# Patient Record
Sex: Female | Born: 1935 | Race: White | Hispanic: No | Smoking: Never smoker
Health system: Southern US, Community
[De-identification: ages and names within clinical notes are randomized; demographics above are authoritative.]

## PROBLEM LIST (undated history)

## (undated) DIAGNOSIS — C801 Malignant (primary) neoplasm, unspecified: Secondary | ICD-10-CM

## (undated) DIAGNOSIS — I10 Essential (primary) hypertension: Secondary | ICD-10-CM

## (undated) DIAGNOSIS — J189 Pneumonia, unspecified organism: Secondary | ICD-10-CM

## (undated) HISTORY — PX: COLONOSCOPY: SHX174

## (undated) HISTORY — PX: CATARACT EXTRACTION, BILATERAL: SHX1313

---

## 2001-02-13 ENCOUNTER — Encounter: Payer: Self-pay | Admitting: Emergency Medicine

## 2001-02-13 ENCOUNTER — Emergency Department (HOSPITAL_COMMUNITY): Admission: EM | Admit: 2001-02-13 | Discharge: 2001-02-13 | Payer: Self-pay

## 2001-03-15 ENCOUNTER — Emergency Department (HOSPITAL_COMMUNITY): Admission: EM | Admit: 2001-03-15 | Discharge: 2001-03-15 | Payer: Self-pay | Admitting: Emergency Medicine

## 2001-11-06 ENCOUNTER — Emergency Department (HOSPITAL_COMMUNITY): Admission: EM | Admit: 2001-11-06 | Discharge: 2001-11-06 | Payer: Self-pay | Admitting: Emergency Medicine

## 2002-05-23 ENCOUNTER — Emergency Department (HOSPITAL_COMMUNITY): Admission: EM | Admit: 2002-05-23 | Discharge: 2002-05-23 | Payer: Self-pay | Admitting: Emergency Medicine

## 2002-05-23 ENCOUNTER — Encounter: Payer: Self-pay | Admitting: Emergency Medicine

## 2003-06-01 ENCOUNTER — Emergency Department (HOSPITAL_COMMUNITY): Admission: EM | Admit: 2003-06-01 | Discharge: 2003-06-01 | Payer: Self-pay | Admitting: Emergency Medicine

## 2004-05-19 ENCOUNTER — Emergency Department (HOSPITAL_COMMUNITY): Admission: EM | Admit: 2004-05-19 | Discharge: 2004-05-19 | Payer: Self-pay | Admitting: Emergency Medicine

## 2004-05-24 ENCOUNTER — Ambulatory Visit (HOSPITAL_COMMUNITY): Admission: RE | Admit: 2004-05-24 | Discharge: 2004-05-24 | Payer: Self-pay | Admitting: Family Medicine

## 2007-06-22 ENCOUNTER — Emergency Department (HOSPITAL_COMMUNITY): Admission: EM | Admit: 2007-06-22 | Discharge: 2007-06-22 | Payer: Self-pay | Admitting: Emergency Medicine

## 2010-04-25 ENCOUNTER — Emergency Department (HOSPITAL_COMMUNITY): Admission: EM | Admit: 2010-04-25 | Discharge: 2010-04-26 | Payer: Self-pay | Admitting: Emergency Medicine

## 2010-10-28 LAB — BASIC METABOLIC PANEL
BUN: 17 mg/dL (ref 6–23)
CO2: 25 mEq/L (ref 19–32)
Calcium: 9.3 mg/dL (ref 8.4–10.5)
Chloride: 101 mEq/L (ref 96–112)
Creatinine, Ser: 0.85 mg/dL (ref 0.4–1.2)
GFR calc Af Amer: >60 mL/min (ref >60)
GFR calc non Af Amer: >60 mL/min (ref >60)
Glucose, Bld: 126 mg/dL — ABNORMAL HIGH (ref 70–99)
Potassium: 3.6 mEq/L (ref 3.5–5.1)
Sodium: 135 mEq/L (ref 135–145)

## 2010-10-28 LAB — CBC
HCT: 41.3 % (ref 36.0–46.0)
Hemoglobin: 14.6 g/dL (ref 12.0–15.0)
MCH: 32.5 pg (ref 26.0–34.0)
MCHC: 35.3 g/dL (ref 30.0–36.0)
MCV: 92 fL (ref 78.0–100.0)
Platelets: 264 10*3/uL (ref 150–400)
RBC: 4.49 MIL/uL (ref 3.87–5.11)
RDW: 13.4 % (ref 11.5–15.5)
WBC: 7.6 10*3/uL (ref 4.0–10.5)

## 2010-10-28 LAB — DIFFERENTIAL
Basophils Absolute: 0 10*3/uL (ref 0.0–0.1)
Eosinophils Relative: 2 % (ref 0–5)
Lymphocytes Relative: 15 % (ref 12–46)
Neutrophils Relative %: 75 % (ref 43–77)

## 2010-10-28 LAB — POCT CARDIAC MARKERS
Myoglobin, poc: 30.6 ng/mL (ref 12–200)
Troponin i, poc: <0.05 ng/mL (ref 0.00–0.09)
Troponin i, poc: <0.05 ng/mL (ref 0.00–0.09)

## 2011-05-24 LAB — PROTIME-INR
INR: 0.9
Prothrombin Time: 11.8

## 2011-05-24 LAB — POCT CARDIAC MARKERS
CKMB, poc: 1.5
Operator id: 4708
Operator id: 4708
Troponin i, poc: <0.05
Troponin i, poc: <0.05

## 2011-05-24 LAB — COMPREHENSIVE METABOLIC PANEL
Alkaline Phosphatase: 82
BUN: 15
Chloride: 105
GFR calc non Af Amer: >60
Glucose, Bld: 122 — ABNORMAL HIGH
Potassium: 3.8
Total Bilirubin: 0.8

## 2011-05-24 LAB — CBC
HCT: 45
Hemoglobin: 15.7 — ABNORMAL HIGH
RDW: 12.8
WBC: 8.3

## 2011-05-24 LAB — DIFFERENTIAL
Basophils Absolute: 0.1
Basophils Relative: 2 — ABNORMAL HIGH
Neutro Abs: 6
Neutrophils Relative %: 72

## 2013-12-07 ENCOUNTER — Encounter (HOSPITAL_COMMUNITY): Payer: Self-pay | Admitting: Emergency Medicine

## 2013-12-07 ENCOUNTER — Emergency Department (HOSPITAL_COMMUNITY): Payer: Medicare Other

## 2013-12-07 ENCOUNTER — Emergency Department (HOSPITAL_COMMUNITY)
Admission: EM | Admit: 2013-12-07 | Discharge: 2013-12-07 | Disposition: A | Discharge status: Home / Self Care | Payer: Medicare Other | Attending: Emergency Medicine | Admitting: Emergency Medicine

## 2013-12-07 DIAGNOSIS — IMO0002 Reserved for concepts with insufficient information to code with codable children: Secondary | ICD-10-CM | POA: Insufficient documentation

## 2013-12-07 DIAGNOSIS — Z23 Encounter for immunization: Secondary | ICD-10-CM | POA: Insufficient documentation

## 2013-12-07 DIAGNOSIS — S1093XA Contusion of unspecified part of neck, initial encounter: Secondary | ICD-10-CM

## 2013-12-07 DIAGNOSIS — S0003XA Contusion of scalp, initial encounter: Secondary | ICD-10-CM | POA: Insufficient documentation

## 2013-12-07 DIAGNOSIS — Y939 Activity, unspecified: Secondary | ICD-10-CM | POA: Insufficient documentation

## 2013-12-07 DIAGNOSIS — S0081XA Abrasion of other part of head, initial encounter: Secondary | ICD-10-CM

## 2013-12-07 DIAGNOSIS — I10 Essential (primary) hypertension: Secondary | ICD-10-CM | POA: Insufficient documentation

## 2013-12-07 DIAGNOSIS — W010XXA Fall on same level from slipping, tripping and stumbling without subsequent striking against object, initial encounter: Secondary | ICD-10-CM | POA: Insufficient documentation

## 2013-12-07 DIAGNOSIS — S0083XA Contusion of other part of head, initial encounter: Secondary | ICD-10-CM | POA: Insufficient documentation

## 2013-12-07 DIAGNOSIS — Y9289 Other specified places as the place of occurrence of the external cause: Secondary | ICD-10-CM | POA: Insufficient documentation

## 2013-12-07 HISTORY — DX: Essential (primary) hypertension: I10

## 2013-12-07 MED ORDER — CLINDAMYCIN HCL 150 MG PO CAPS: 150.0000 mg | ORAL_CAPSULE | Freq: Three times a day (TID) | ORAL | Status: DC

## 2013-12-07 MED ORDER — TETANUS-DIPHTH-ACELL PERTUSSIS 5-2.5-18.5 LF-MCG/0.5 IM SUSP
0.5000 mL | Freq: Once | INTRAMUSCULAR | Status: AC
  Administered 2013-12-07: 0.5 mL via INTRAMUSCULAR
  Filled 2013-12-07: qty 0.5

## 2013-12-07 NOTE — ED Provider Notes (Signed)
CSN: [id number]     Arrival date & time 12/07/13  1141 History   First MD Initiated Contact with Patient 12/07/13 1207     Chief Complaint  Patient presents with  . Fall  . Facial Injury     (Consider location/radiation/quality/duration/timing/severity/associated sxs/prior Treatment) Patient is a 80 y.o. female presenting with fall and facial injury. The history is provided by the patient and a relative.  Fall  Facial Injury  patient fell 4 days ago when she tripped over a step. No loss of consciousness. Suffered abrasions to the right side of her face which were treated with peroxide and vitamin E cream. Went to urgent care today and was sent here for evaluation of possible intercranial hemorrhage. He felt that the patient's pupils weren't equal. She herself denies any confusion. No increased lethargy. She's had some dizziness has been persistent. Denies any focal weakness. No vomiting. Denies any neck pain. Has used over-the-counter medications for symptomatic relief for her face. Denies any visual changes. No trouble with mastication.  Past Medical History  Diagnosis Date  . Hypertension    History reviewed. No pertinent past surgical history. No family history on file. History  Substance Use Topics  . Smoking status: Never Smoker   . Smokeless tobacco: Not on file  . Alcohol Use: Yes     Comment: socially   OB History   Grav Para Term Preterm Abortions TAB SAB Ect Mult Living                 Review of Systems  All other systems reviewed and are negative.     Allergies  Wasp venom  Home Medications   Prior to Admission medications   Not on File   BP 213/109  Pulse 87  Temp(Src) 97.7 F (36.5 C) (Oral)  Resp 16  SpO2 97% Physical Exam  Nursing note and vitals reviewed. Constitutional: She is oriented to person, place, and time. She appears well-developed and well-nourished.  Non-toxic appearance. No distress.  HENT:  Head: Normocephalic and atraumatic.     Eyes: Conjunctivae, EOM and lids are normal. Pupils are equal, round, and reactive to light.  Neck: Normal range of motion. Neck supple. No tracheal deviation present. No mass present.  Cardiovascular: Normal rate, regular rhythm and normal heart sounds.  Exam reveals no gallop.   No murmur heard. Pulmonary/Chest: Effort normal and breath sounds normal. No stridor. No respiratory distress. She has no decreased breath sounds. She has no wheezes. She has no rhonchi. She has no rales.  Abdominal: Soft. Normal appearance and bowel sounds are normal. She exhibits no distension. There is no tenderness. There is no rebound and no CVA tenderness.  Musculoskeletal: Normal range of motion. She exhibits no edema and no tenderness.  Neurological: She is alert and oriented to person, place, and time. She has normal strength. No cranial nerve deficit or sensory deficit. GCS eye subscore is 4. GCS verbal subscore is 5. GCS motor subscore is 6.  Skin: Skin is warm and dry. No abrasion and no rash noted.  Psychiatric: She has a normal mood and affect. Her speech is normal and behavior is normal.    ED Course  Procedures (including critical care time) Labs Review Labs Reviewed - No data to display  Imaging Review No results found.   EKG Interpretation None      MDM   Final diagnoses:  None    xrays neg for acute fracture, tetanus updated--blood pressure elevation noted and pt instructed  to f/u her pcp    Leota Jacobsen, MD 12/07/13 1414

## 2013-12-07 NOTE — ED Notes (Signed)
Patient transported to CT 

## 2013-12-07 NOTE — ED Notes (Signed)
Bruising noted to r side of face.

## 2013-12-07 NOTE — ED Notes (Signed)
MD at bedside. 

## 2013-12-07 NOTE — ED Notes (Addendum)
Pt from home c/o tripping on a stone outside, falling onto a paved sidewalk. Pt has abrasions and bruising to R side of face. Pt denies LOC. Pt states that she had neck pain , but went to chiropractor and he fixed it. Pt PCP sent pt for scan because she is having dizziness, nausea and pt R eye is slightly elevated and HTN. Pt denies visual changes. Pt is A&O and in NAD. Pt does not take blood thinners

## 2013-12-07 NOTE — Discharge Instructions (Signed)
Use neopsorin to your face once per day, start the antibiotics only if you note increased redness--follow up with your doctor for your elevated blood pressure Abrasion An abrasion is a cut or scrape of the skin. Abrasions do not extend through all layers of the skin and most heal within 10 days. It is important to care for your abrasion properly to prevent infection. CAUSES  Most abrasions are caused by falling on, or gliding across, the ground or other surface. When your skin rubs on something, the outer and inner layer of skin rubs off, causing an abrasion. DIAGNOSIS  Your caregiver will be able to diagnose an abrasion during a physical exam.  TREATMENT  Your treatment depends on how large and deep the abrasion is. Generally, your abrasion will be cleaned with water and a mild soap to remove any dirt or debris. An antibiotic ointment may be put over the abrasion to prevent an infection. A bandage (dressing) may be wrapped around the abrasion to keep it from getting dirty.  You may need a tetanus shot if:  You cannot remember when you had your last tetanus shot.  You have never had a tetanus shot.  The injury broke your skin. If you get a tetanus shot, your arm may swell, get red, and feel warm to the touch. This is common and not a problem. If you need a tetanus shot and you choose not to have one, there is a rare chance of getting tetanus. Sickness from tetanus can be serious.  HOME CARE INSTRUCTIONS   If a dressing was applied, change it at least once a day or as directed by your caregiver. If the bandage sticks, soak it off with warm water.   Wash the area with water and a mild soap to remove all the ointment 2 times a day. Rinse off the soap and pat the area dry with a clean towel.   Reapply any ointment as directed by your caregiver. This will help prevent infection and keep the bandage from sticking. Use gauze over the wound and under the dressing to help keep the bandage from  sticking.   Change your dressing right away if it becomes wet or dirty.   Only take over-the-counter or prescription medicines for pain, discomfort, or fever as directed by your caregiver.   Follow up with your caregiver within 24 48 hours for a wound check, or as directed. If you were not given a wound-check appointment, look closely at your abrasion for redness, swelling, or pus. These are signs of infection. SEEK IMMEDIATE MEDICAL CARE IF:   You have increasing pain in the wound.   You have redness, swelling, or tenderness around the wound.   You have pus coming from the wound.   You have a fever or persistent symptoms for more than 2 3 days.  You have a fever and your symptoms suddenly get worse.  You have a bad smell coming from the wound or dressing.  MAKE SURE YOU:   Understand these instructions.  Will watch your condition.  Will get help right away if you are not doing well or get worse. Document Released: 05/11/2005 Document Revised: 07/18/2012 Document Reviewed: 07/05/2011 [facility] Patient Information 2014 [place], [place]. Head Injury, Adult You have received a head injury. It does not appear serious at this time. Headaches and vomiting are common following head injury. It should be easy to awaken from sleeping. Sometimes it is necessary for you to stay in the emergency department for a  while for observation. Sometimes admission to the hospital may be needed. After injuries such as yours, most problems occur within the first 24 hours, but side effects may occur up to 7 10 days after the injury. It is important for you to carefully monitor your condition and contact your health care provider or seek immediate medical care if there is a change in your condition. WHAT ARE THE TYPES OF HEAD INJURIES? Head injuries can be as minor as a bump. Some head injuries can be more severe. More severe head injuries include:  A jarring injury to the brain (concussion).  A bruise  of the brain (contusion). This mean there is bleeding in the brain that can cause swelling.  A cracked skull (skull fracture).  Bleeding in the brain that collects, clots, and forms a bump (hematoma). WHAT CAUSES A HEAD INJURY? A serious head injury is most likely to happen to someone who is in a car wreck and is not wearing a seat belt. Other causes of major head injuries include bicycle or motorcycle accidents, sports injuries, and falls. HOW ARE HEAD INJURIES DIAGNOSED? A complete history of the event leading to the injury and your current symptoms will be helpful in diagnosing head injuries. Many times, pictures of the brain, such as CT or MRI are needed to see the extent of the injury. Often, an overnight hospital stay is necessary for observation.  WHEN SHOULD I SEEK IMMEDIATE MEDICAL CARE?  You should get help right away if:  You have confusion or drowsiness.  You feel sick to your stomach (nauseous) or have continued, forceful vomiting.  You have dizziness or unsteadiness that is getting worse.  You have severe, continued headaches not relieved by medicine. Only take over-the-counter or prescription medicines for pain, fever, or discomfort as directed by your health care provider.  You do not have normal function of the arms or legs or are unable to walk.  You notice changes in the black spots in the center of the colored part of your eye (pupil).  You have a clear or bloody fluid coming from your nose or ears.  You have a loss of vision. During the next 24 hours after the injury, you must stay with someone who can watch you for the warning signs. This person should contact local emergency services (911 in the [place] if you have seizures, you become unconscious, or you are unable to wake up. HOW CAN I PREVENT A HEAD INJURY IN THE FUTURE? The most important factor for preventing major head injuries is avoiding motor vehicle accidents. To minimize the potential for damage to your  head, it is crucial to wear seat belts while riding in motor vehicles. Wearing helmets while bike riding and playing collision sports (like football) is also helpful. Also, avoiding dangerous activities around the house will further help reduce your risk of head injury.  WHEN CAN I RETURN TO NORMAL ACTIVITIES AND ATHLETICS? You should be reevaluated by your health care provider before returning to these activities. If you have any of the following symptoms, you should not return to activities or contact sports until 1 week after the symptoms have stopped:  Persistent headache.  Dizziness or vertigo.  Poor attention and concentration.  Confusion.  Memory problems.  Nausea or vomiting.  Fatigue or tire easily.  Irritability.  Intolerant of bright lights or loud noises.  Anxiety or depression.  Disturbed sleep. MAKE SURE YOU:   Understand these instructions.  Will watch your condition.  Will get  help right away if you are not doing well or get worse. Document Released: 08/01/2005 Document Revised: 05/22/2013 Document Reviewed: 04/08/2013 [facility] Patient Information 2014 [place].

## 2014-08-20 ENCOUNTER — Encounter: Payer: Self-pay | Admitting: Podiatry

## 2014-08-20 ENCOUNTER — Ambulatory Visit (INDEPENDENT_AMBULATORY_CARE_PROVIDER_SITE_OTHER): Payer: Medicare Other | Admitting: Podiatry

## 2014-08-20 ENCOUNTER — Ambulatory Visit (INDEPENDENT_AMBULATORY_CARE_PROVIDER_SITE_OTHER): Payer: Medicare Other

## 2014-08-20 VITALS — BP 178/91 | HR 69 | Resp 16 | Ht 63.0 in | Wt 186.0 lb

## 2014-08-20 DIAGNOSIS — M10071 Idiopathic gout, right ankle and foot: Secondary | ICD-10-CM

## 2014-08-20 DIAGNOSIS — L84 Corns and callosities: Secondary | ICD-10-CM

## 2014-08-20 DIAGNOSIS — M779 Enthesopathy, unspecified: Secondary | ICD-10-CM

## 2014-08-20 DIAGNOSIS — M898X0 Other specified disorders of bone, multiple sites: Secondary | ICD-10-CM

## 2014-08-20 DIAGNOSIS — M109 Gout, unspecified: Secondary | ICD-10-CM

## 2014-08-20 DIAGNOSIS — Q786 Multiple congenital exostoses: Secondary | ICD-10-CM

## 2014-08-20 MED ORDER — TRIAMCINOLONE ACETONIDE 10 MG/ML IJ SUSP
10.0000 mg | Freq: Once | INTRAMUSCULAR | Status: AC
  Administered 2014-08-20: 10 mg

## 2014-08-20 NOTE — Progress Notes (Signed)
Subjective:     Patient ID: Tracy Blackwell, female   DOB: February 21, 1934, 81 y.o.   MRN: [id number]  HPI patient presents with painful bunion deformity bilateral and severe redness inflammation and swelling around the first MPJ right over the last several days. Also complains of a painful keratotic lesion fifth digit right foot and a small lesion between the fourth and third toe right foot   Review of Systems  All other systems reviewed and are negative.      Objective:   Physical Exam  Constitutional: She is oriented to person, place, and time.  Cardiovascular: Intact distal pulses.   Musculoskeletal: Normal range of motion.  Neurological: She is oriented to person, place, and time.  Skin: Skin is warm.  Nursing note and vitals reviewed.  neurovascular status intact with muscle strength adequate and range of motion subtalar and midtarsal joint within normal limits. Patient is noted to have a structural deformity of the first MPJ bilateral with redness and swelling around the first MPJ right and is found to have a distal medial keratotic lesion fifth toe right it's very tender and a small lesion between the fourth and third toe of the right foot. Patient's digits are well-perfused and she'll oriented 3     Assessment:     Structural deformity of both feet with probable acute gout capsulitis of the right first MPJ and exostotic lesion fifth digit right with corn callus formation    Plan:     H&P and conditions discussed and x-rays reviewed. At this time I injected the right first MPJ 3 mg Kenalog 5 mg Xylocaine and debrided the lesion on the right fifth toe and padded. Discussed surgery in the office for the right fifth toe and ultimately we could consider bunion surgery but I would like to defer or avoid this at all possible cost

## 2014-08-20 NOTE — Progress Notes (Signed)
   Subjective:    Patient ID: Tracy Blackwell, female    DOB: 01/28/1934, 81 y.o.   MRN: [id number]  HPI Comments: The right foot , it flared up all of a sudden on Sunday or Monday. Really bad pain , it is swollen and red and hot .   Foot Pain      Review of Systems  All other systems reviewed and are negative.      Objective:   Physical Exam        Assessment & Plan:

## 2014-08-20 NOTE — Patient Instructions (Signed)

## 2014-08-28 ENCOUNTER — Encounter: Payer: Self-pay | Admitting: Podiatry

## 2014-08-28 ENCOUNTER — Ambulatory Visit (INDEPENDENT_AMBULATORY_CARE_PROVIDER_SITE_OTHER): Payer: Medicare Other | Admitting: Podiatry

## 2014-08-28 VITALS — BP 203/86 | HR 67 | Resp 16

## 2014-08-28 DIAGNOSIS — M779 Enthesopathy, unspecified: Secondary | ICD-10-CM

## 2014-08-28 DIAGNOSIS — B351 Tinea unguium: Secondary | ICD-10-CM

## 2014-08-28 DIAGNOSIS — M79673 Pain in unspecified foot: Secondary | ICD-10-CM

## 2014-08-28 MED ORDER — TRIAMCINOLONE ACETONIDE 10 MG/ML IJ SUSP
10.0000 mg | Freq: Once | INTRAMUSCULAR | Status: AC
  Administered 2014-08-28: 10 mg

## 2014-08-28 NOTE — Progress Notes (Signed)
Subjective:     Patient ID: Tracy Blackwell, female   DOB: June 23, 1934, 81 y.o.   MRN: [id number]  HPI patient states she's doing better with her right foot but having pain now in her left big toe joint and she needs her nails cut as she cannot do it   Review of Systems     Objective:   Physical Exam Neurovascular status intact with thick nailbeds that are nonpainful and inflammation around the first MPJ left with improved inflammation first MPJ right    Assessment:     Inflammatory capsulitis first MPJ left with nail disease 1-5 both feet    Plan:     Debridement nailbeds 1-5 both feet with no iatrogenic bleeding and injected the left first MPJ 3 mg Kenalog 5 mg Xylocaine

## 2016-10-07 ENCOUNTER — Encounter: Payer: Self-pay | Admitting: Podiatry

## 2016-10-07 ENCOUNTER — Ambulatory Visit (INDEPENDENT_AMBULATORY_CARE_PROVIDER_SITE_OTHER): Payer: Medicare Other | Admitting: Podiatry

## 2016-10-07 VITALS — BP 158/90 | HR 65 | Resp 16

## 2016-10-07 DIAGNOSIS — L600 Ingrowing nail: Secondary | ICD-10-CM | POA: Diagnosis not present

## 2016-10-07 NOTE — Patient Instructions (Signed)

## 2016-10-08 NOTE — Progress Notes (Signed)
Subjective:     Patient ID: Tracy Blackwell, female   DOB: 1933-09-03, 83 y.o.   MRN: [id number]  HPI patient presents stating I'm having a lot of discomfort on my left toe on the inside border and I cannot cut it and I've tried to trim it and it's been present for at least a month with some redness but no drainage   Review of Systems  All other systems reviewed and are negative.      Objective:   Physical Exam  Constitutional: She is oriented to person, place, and time.  Cardiovascular: Intact distal pulses.   Musculoskeletal: Normal range of motion.  Neurological: She is oriented to person, place, and time.  Skin: Skin is warm.  Nursing note and vitals reviewed.  neurovascular status intact muscle strength adequate range of motion within normal limits with patient found to have incurvated left hallux lateral border that's very painful when pressed and making shoe gear difficult. There is redness associated with it but no active drainage process and patient's found to have good digital perfusion and well oriented 3     Assessment:     Ingrown toenail deformity left hallux lateral border that's painful when pressed and making shoe gear difficult with no indication of infection    Plan:     H&P condition reviewed and at this point I have recommended correction of the ingrown toenail. I explained procedure and risk and patient wants this done understanding risk and today I infiltrated left hallux 60 mg Xylocaine Marcaine mixture remove the lateral border sterile technique exposed matrix and applied phenol 3 applications 30 seconds followed by alcohol lavage and sterile dressing. Gave instructions on soaks and reappoint and encouraged to call with any questions

## 2016-10-31 ENCOUNTER — Encounter: Payer: Self-pay | Admitting: Podiatry

## 2016-10-31 ENCOUNTER — Ambulatory Visit (INDEPENDENT_AMBULATORY_CARE_PROVIDER_SITE_OTHER): Payer: Medicare Other | Admitting: Podiatry

## 2016-10-31 DIAGNOSIS — L03032 Cellulitis of left toe: Secondary | ICD-10-CM | POA: Diagnosis not present

## 2016-10-31 MED ORDER — CEPHALEXIN 500 MG PO CAPS: 500.0000 mg | ORAL_CAPSULE | Freq: Two times a day (BID) | ORAL | 0 refills | Status: DC

## 2016-10-31 NOTE — Progress Notes (Signed)
Subjective:     Patient ID: Tracy Blackwell, female   DOB: [birth year], 83 y.o.   MRN: [id number]  HPI patient presents stating that the left big toe has some redness in the corner and she's concerned about infection   Review of Systems     Objective:   Physical Exam Neurovascular status intact with redness at the proximal nail fold left hallux that's localized with no proximal edema erythema or drainage noted at this time. It is mildly tender and there was no active oozing currently    Assessment:     Localized paronychia infection left hallux lateral border    Plan:     Explained continuing to soak and let it air dry during the day and night which she is not been doing and I did as precautionary measure placed her on cephalexin 500 mg twice a day and I instructed that if any further redness drainage or pain were to occur I want to see her back

## 2017-07-05 ENCOUNTER — Other Ambulatory Visit: Payer: Self-pay | Admitting: Family Medicine

## 2017-07-05 ENCOUNTER — Ambulatory Visit
Admission: RE | Admit: 2017-07-05 | Discharge: 2017-07-05 | Disposition: A | Discharge status: Home / Self Care | Payer: Medicare Other | Source: Ambulatory Visit | Attending: Family Medicine | Admitting: Family Medicine

## 2017-07-05 DIAGNOSIS — R52 Pain, unspecified: Secondary | ICD-10-CM

## 2017-07-05 MED ORDER — IOPAMIDOL (ISOVUE-300) INJECTION 61%
100.0000 mL | Freq: Once | INTRAVENOUS | Status: AC | PRN
  Administered 2017-07-05: 100 mL via INTRAVENOUS

## 2017-07-27 ENCOUNTER — Telehealth: Payer: Self-pay | Admitting: Oncology

## 2017-07-27 ENCOUNTER — Telehealth: Payer: Self-pay

## 2017-07-27 NOTE — Telephone Encounter (Signed)
Per response from GI navigator GBS can see patient tomorrow 12/14 @ 2pm. Appointment added - left message for patient asking that she call me back asap to confirm if she can come in tomorrow.

## 2017-07-27 NOTE — Telephone Encounter (Signed)
Called patient to explain why she was referred to see Dr. Benay Spice. I also spoke with patient's daughter-in-law. Patient states that things are happening very quickly. She stated that she will be at the appointment on 07/28/17.

## 2017-07-28 ENCOUNTER — Ambulatory Visit: Payer: Medicare Other | Admitting: Oncology

## 2017-07-28 ENCOUNTER — Telehealth: Payer: Self-pay | Admitting: Oncology

## 2017-07-28 ENCOUNTER — Other Ambulatory Visit: Payer: Self-pay | Admitting: Surgery

## 2017-07-28 VITALS — BP 180/70 | HR 71 | Temp 97.7°F | Resp 18 | Ht 63.0 in | Wt 154.6 lb

## 2017-07-28 DIAGNOSIS — C19 Malignant neoplasm of rectosigmoid junction: Secondary | ICD-10-CM | POA: Diagnosis not present

## 2017-07-28 DIAGNOSIS — K5900 Constipation, unspecified: Secondary | ICD-10-CM | POA: Diagnosis not present

## 2017-07-28 DIAGNOSIS — C189 Malignant neoplasm of colon, unspecified: Secondary | ICD-10-CM

## 2017-07-28 DIAGNOSIS — I10 Essential (primary) hypertension: Secondary | ICD-10-CM

## 2017-07-28 DIAGNOSIS — C187 Malignant neoplasm of sigmoid colon: Secondary | ICD-10-CM

## 2017-07-28 NOTE — Telephone Encounter (Signed)
Scheduled appt per 12/14 los - Gave patient AVS,.

## 2017-07-28 NOTE — Progress Notes (Signed)
[name] Patient Consult   Referring MD: Nakiah Osgood Gibbard 83 y.o.  June 25, 1934    Reason for Referral: Colon cancer   HPI: Tracy Blackwell reports sudden onset abdominal pain lasting for approximately 3 days.  She saw her primary physician and reports she was felt to possibly have diverticulitis.  She was referred for a CT of the abdomen/pelvis 07/05/2017.  The liver appeared normal.  A partially obstructing mass was noted at the rectosigmoid junction with mild perirectal interstitial thickening.  The proximal colon was dilated and fluid-filled.  No retroperitoneal or retrocrural adenopathy.  Small nodes were noted within the mesorectum.  No pelvic sidewall adenopathy.  She was referred to Dr. Therisa Doyne and was taken to a colonoscopy on 07/17/2017.  A partially obstructing mass was found in the sigmoid colon at 25 cm from the anal verge.  The mass was circumferential.  The mass was biopsied and the area was tattooed.  A malignant appearing stenosis was encountered and was not traversed.  The scope could not advance beyond the obstructing mass.  The pathology revealed invasive moderately differentiated adenocarcinoma.  She was referred to Dr. Dema Severin.  He plans to present her at the GI tumor conference next week.  She is referred for oncology evaluation to consider the indication for neoadjuvant therapy.  Past Medical History:  Diagnosis Date  . Hypertension     .  G4P4, 1 stillborn child   Past surgical history: .  Hysterectomy at age 5  Medications: Reviewed  Allergies:  Allergies  Allergen Reactions  . Wasp Venom Anaphylaxis  . Codeine Nausea And Vomiting    Family history: No family history of cancer  Social History:   She lives with her son in [place].  She works in a business occupation.  She does not use cigarettes or alcohol.  No transfusion history.  No risk factor for sexually transmitted diseases.  ROS:   Positives include: Mild  right foot drop for the past several weeks, altered taste causing weight loss, intermittent constipation  A complete ROS was otherwise negative.  Physical Exam:  Blood pressure (!) 180/70, pulse 71, temperature 97.7 F (36.5 C), temperature source Oral, resp. rate 18, height 5' 3"  (1.6 m), weight 154 lb 9.6 oz (70.1 kg), SpO2 100 %.  HEENT: Upper denture plate, oropharynx without visible mass, neck without mass Lungs: Clear bilaterally Cardiac: Regular rate and rhythm Abdomen: No hepatosplenomegaly, no mass, nontender  Vascular: No leg edema Lymph nodes: No cervical, supraclavicular, axillary, or inguinal nodes Neurologic: Alert and oriented, the motor exam appears intact in the upper and lower extremities except for 4/5 strength with dorsiflexion at the right foot.  The deep tendon reflexes are 2+ and symmetric at the knees.  She ambulates without a limp. Skin: Multiple benign-appearing moles over the trunk Musculoskeletal: No spine tenderness   LAB:   Imaging:  As per HPI, CT abdomen/pelvis 07/05/2017-images reviewed with Tracy Blackwell and her daughter   Assessment/Plan:   1. Adenocarcinoma of the colon  Partially obstructing mass at 25 cm on colonoscopy 07/17/2017 with a biopsy confirming moderately differentiated adenocarcinoma, incomplete colonoscopy  CT abdomen/pelvis 07/05/2017-rectosigmoid mass with evidence of transmural spread and regional adenopathy, no evidence of distant metastatic disease 2. Hypertension   Disposition:   Tracy Blackwell has been diagnosed with colon cancer.  She presents with a partially obstructing mass at the rectosigmoid colon.  The mass was measured at 25 cm by colonoscopy, but appears at the rectosigmoid  junction on CT.  She appears to have locally advanced disease by CT staging.  I suspect the pain she experienced last month was related to partial colonic obstruction.  I discussed the diagnosis of colorectal cancer with Tracy Blackwell and her  daughter.  I explained the distinction between colon and rectal cancer.  My initial impression is she will be a candidate for upfront resection of the memory tumor.  We can make a decision on adjuvant therapy based on the final surgical pathology.  Her case will be presented at the GI tumor conference 08/02/2017.  A final treatment recommendation will be based on the tumor conference discussion and recommendation of Dr. Dema Severin.  I will recommend a preoperative CEA and staging chest CT.  Her history does not suggest hereditary non-polyposis colon cancer syndrome.  The resection specimen will be tested for MSI and mismatch repair protein expression.  Her family members are at increased risk of developing colorectal cancer and should receive appropriate screening.  She will continue a stool softener.  50 minutes were spent with the patient today.  The majority of the time was used for counseling and coordination of care.  Betsy Coder, MD  07/28/2017, 6:14 PM

## 2017-08-02 ENCOUNTER — Ambulatory Visit: Payer: Self-pay | Admitting: Surgery

## 2017-08-04 ENCOUNTER — Ambulatory Visit
Admission: RE | Admit: 2017-08-04 | Discharge: 2017-08-04 | Disposition: A | Discharge status: Home / Self Care | Payer: Medicare Other | Source: Ambulatory Visit | Attending: Surgery | Admitting: Surgery

## 2017-08-04 DIAGNOSIS — C189 Malignant neoplasm of colon, unspecified: Secondary | ICD-10-CM

## 2017-08-04 MED ORDER — IOPAMIDOL (ISOVUE-300) INJECTION 61%
75.0000 mL | Freq: Once | INTRAVENOUS | Status: AC | PRN
  Administered 2017-08-04: 75 mL via INTRAVENOUS

## 2017-08-09 ENCOUNTER — Other Ambulatory Visit: Payer: Self-pay | Admitting: Urology

## 2017-08-10 NOTE — Patient Instructions (Addendum)
Tracy Blackwell  08/10/2017   Your procedure is scheduled on: Monday, Dec. 31, 2018   Report to [facility] Main  Entrance   Take [place]  elevators to 3rd floor to  [place] at 530 AM.    Call this number if you have problems the morning of surgery [phone]    Remember: ONLY 1 PERSON MAY GO WITH YOU TO SHORT STAY TO GET  READY MORNING OF [place].   Do not eat food or drink liquids :After Midnight. FOLLOW ANY BOWEL PREP INSTRUCTIONS YOU RECEIVE FROM DR WHITE     Take these medicines the morning of surgery with A SIP OF WATER: LORAZEPAM (ATIVAN),  IF NEEDED, METORPOLOL SUCCINATE (TOPROL XL), AMLODIPINE (NORVASC)                               You may not have any metal on your body including hair pins and piercings               Do not wear jewelry, make-up, lotions, powders or perfumes, deodorant             Do not wear nail polish.  Do not shave  48 hours prior to surgery.                Do not bring valuables to the hospital. [place].   Contacts, dentures or bridgework may not be worn into surgery.   Leave suitcase in the car. After surgery it may be brought to your room.                 Please read over the following fact sheets you were given: _____________________________________________________________________       [facility] - Preparing for Surgery Before surgery, you can play an important role.  Because skin is not sterile, your skin needs to be as free of germs as possible.  You can reduce the number of germs on your skin by washing with CHG (chlorahexidine gluconate) soap before surgery.  CHG is an antiseptic cleaner which kills germs and bonds with the skin to continue killing germs even after washing. Please DO NOT use if you have an allergy to CHG or antibacterial soaps.  If your skin becomes reddened/irritated stop using the CHG and inform your nurse when you arrive at Short  Stay. Do not shave (including legs and underarms) for at least 48 hours prior to the first CHG shower.  You may shave your face/neck. Please follow these instructions carefully:  1.  Shower with CHG Soap the night before surgery and the  morning of Surgery.  2.  If you choose to wash your hair, wash your hair first as usual with your  normal  shampoo.  3.  After you shampoo, rinse your hair and body thoroughly to remove the  shampoo.                           4.  Use CHG as you would any other liquid soap.  You can apply chg directly  to the skin and wash  Gently with a scrungie or clean washcloth.  5.  Apply the CHG Soap to your body ONLY FROM THE NECK DOWN.   Do not use on face/ open                           Wound or open sores. Avoid contact with eyes, ears mouth and genitals (private parts).                       Wash face,  Genitals (private parts) with your normal soap.             6.  Wash thoroughly, paying special attention to the area where your surgery  will be performed.  7.  Thoroughly rinse your body with warm water from the neck down.  8.  DO NOT shower/wash with your normal soap after using and rinsing off  the CHG Soap.                9.  Pat yourself dry with a clean towel.            10.  Wear clean pajamas.            11.  Place clean sheets on your bed the night of your first shower and do not  sleep with pets. Day of Surgery : Do not apply any lotions/deodorants the morning of surgery.  Please wear clean clothes to the hospital/surgery center.  FAILURE TO FOLLOW THESE INSTRUCTIONS MAY RESULT IN THE CANCELLATION OF YOUR SURGERY PATIENT SIGNATURE_________________________________  NURSE SIGNATURE__________________________________  ________________________________________________________________________   Adam Phenix  An incentive spirometer is a tool that can help keep your lungs clear and active. This tool measures how well you are  filling your lungs with each breath. Taking long deep breaths may help reverse or decrease the chance of developing breathing (pulmonary) problems (especially infection) following:  A long period of time when you are unable to move or be active. BEFORE THE PROCEDURE   If the spirometer includes an indicator to show your best effort, your nurse or respiratory therapist will set it to a desired goal.  If possible, sit up straight or lean slightly forward. Try not to slouch.  Hold the incentive spirometer in an upright position. INSTRUCTIONS FOR USE  1. Sit on the edge of your bed if possible, or sit up as far as you can in bed or on a chair. 2. Hold the incentive spirometer in an upright position. 3. Breathe out normally. 4. Place the mouthpiece in your mouth and seal your lips tightly around it. 5. Breathe in slowly and as deeply as possible, raising the piston or the ball toward the top of the column. 6. Hold your breath for 3-5 seconds or for as long as possible. Allow the piston or ball to fall to the bottom of the column. 7. Remove the mouthpiece from your mouth and breathe out normally. 8. Rest for a few seconds and repeat Steps 1 through 7 at least 10 times every 1-2 hours when you are awake. Take your time and take a few normal breaths between deep breaths. 9. The spirometer may include an indicator to show your best effort. Use the indicator as a goal to work toward during each repetition. 10. After each set of 10 deep breaths, practice coughing to be sure your lungs are clear. If you have an incision (the cut made at the time of surgery),  support your incision when coughing by placing a pillow or rolled up towels firmly against it. Once you are able to get out of bed, walk around indoors and cough well. You may stop using the incentive spirometer when instructed by your caregiver.  RISKS AND COMPLICATIONS  Take your time so you do not get dizzy or light-headed.  If you are in pain,  you may need to take or ask for pain medication before doing incentive spirometry. It is harder to take a deep breath if you are having pain. AFTER USE  Rest and breathe slowly and easily.  It can be helpful to keep track of a log of your progress. Your caregiver can provide you with a simple table to help with this. If you are using the spirometer at home, follow these instructions: [place] IF:   You are having difficultly using the spirometer.  You have trouble using the spirometer as often as instructed.  Your pain medication is not giving enough relief while using the spirometer.  You develop fever of 100.5 F (38.1 C) or higher. SEEK IMMEDIATE MEDICAL CARE IF:   You cough up bloody sputum that had not been present before.  You develop fever of 102 F (38.9 C) or greater.  You develop worsening pain at or near the incision site. MAKE SURE YOU:   Understand these instructions.  Will watch your condition.  Will get help right away if you are not doing well or get worse. Document Released: 12/12/2006 Document Revised: 10/24/2011 Document Reviewed: 02/12/2007 ExitCare Patient Information 2014 ExitCare, [place].   ________________________________________________________________________  WHAT IS A BLOOD TRANSFUSION? Blood Transfusion Information  A transfusion is the replacement of blood or some of its parts. Blood is made up of multiple cells which provide different functions.  Red blood cells carry oxygen and are used for blood loss replacement.  White blood cells fight against infection.  Platelets control bleeding.  Plasma helps clot blood.  Other blood products are available for specialized needs, such as hemophilia or other clotting disorders. BEFORE THE TRANSFUSION  Who gives blood for transfusions?   Healthy volunteers who are fully evaluated to make sure their blood is safe. This is blood bank blood. Transfusion therapy is the safest it has ever  been in the practice of medicine. Before blood is taken from a donor, a complete history is taken to make sure that person has no history of diseases nor engages in risky social behavior (examples are intravenous drug use or sexual activity with multiple partners). The donor's travel history is screened to minimize risk of transmitting infections, such as malaria. The donated blood is tested for signs of infectious diseases, such as HIV and hepatitis. The blood is then tested to be sure it is compatible with you in order to minimize the chance of a transfusion reaction. If you or a relative donates blood, this is often done in anticipation of surgery and is not appropriate for emergency situations. It takes many days to process the donated blood. RISKS AND COMPLICATIONS Although transfusion therapy is very safe and saves many lives, the main dangers of transfusion include:   Getting an infectious disease.  Developing a transfusion reaction. This is an allergic reaction to something in the blood you were given. Every precaution is taken to prevent this. The decision to have a blood transfusion has been considered carefully by your caregiver before blood is given. Blood is not given unless the benefits outweigh the risks. AFTER THE TRANSFUSION  Right after receiving a blood transfusion, you will usually feel much better and more energetic. This is especially true if your red blood cells have gotten low (anemic). The transfusion raises the level of the red blood cells which carry oxygen, and this usually causes an energy increase.  The nurse administering the transfusion will monitor you carefully for complications. HOME CARE INSTRUCTIONS  No special instructions are needed after a transfusion. You may find your energy is better. Speak with your caregiver about any limitations on activity for underlying diseases you may have. SEEK MEDICAL CARE IF:   Your condition is not improving after your  transfusion.  You develop redness or irritation at the intravenous (IV) site. SEEK IMMEDIATE MEDICAL CARE IF:  Any of the following symptoms occur over the next 12 hours:  Shaking chills.  You have a temperature by mouth above 102 F (38.9 C), not controlled by medicine.  Chest, back, or muscle pain.  People around you feel you are not acting correctly or are confused.  Shortness of breath or difficulty breathing.  Dizziness and fainting.  You get a rash or develop hives.  You have a decrease in urine output.  Your urine turns a dark color or changes to pink, red, or brown. Any of the following symptoms occur over the next 10 days:  You have a temperature by mouth above 102 F (38.9 C), not controlled by medicine.  Shortness of breath.  Weakness after normal activity.  The white part of the eye turns yellow (jaundice).  You have a decrease in the amount of urine or are urinating less often.  Your urine turns a dark color or changes to pink, red, or brown. Document Released: 07/29/2000 Document Revised: 10/24/2011 Document Reviewed: 03/17/2008 [facility] Patient Information 2014 [place], [place].  _______________________________________________________________________

## 2017-08-10 NOTE — Progress Notes (Signed)
CHEST CT 08-04-17 EPIC

## 2017-08-11 ENCOUNTER — Encounter (HOSPITAL_COMMUNITY): Payer: Self-pay

## 2017-08-11 ENCOUNTER — Encounter (HOSPITAL_COMMUNITY)
Admission: RE | Admit: 2017-08-11 | Discharge: 2017-08-11 | Disposition: A | Discharge status: Home / Self Care | Payer: Medicare Other | Source: Ambulatory Visit | Attending: Surgery | Admitting: Surgery

## 2017-08-11 ENCOUNTER — Other Ambulatory Visit: Payer: Self-pay

## 2017-08-11 HISTORY — DX: Pneumonia, unspecified organism: J18.9

## 2017-08-11 HISTORY — DX: Malignant (primary) neoplasm, unspecified: C80.1

## 2017-08-11 LAB — COMPREHENSIVE METABOLIC PANEL
ALK PHOS: 79 U/L (ref 38–126)
ALT: 14 U/L (ref 14–54)
AST: 25 U/L (ref 15–41)
Albumin: 4 g/dL (ref 3.5–5.0)
Anion gap: 8 (ref 5–15)
BILIRUBIN TOTAL: 0.6 mg/dL (ref 0.3–1.2)
BUN: 15 mg/dL (ref 6–20)
CALCIUM: 9.5 mg/dL (ref 8.9–10.3)
CO2: 27 mmol/L (ref 22–32)
CREATININE: 0.76 mg/dL (ref 0.44–1.00)
Chloride: 103 mmol/L (ref 101–111)
GFR calc Af Amer: >60 mL/min (ref >60)
Glucose, Bld: 89 mg/dL (ref 65–99)
Potassium: 4.5 mmol/L (ref 3.5–5.1)
Sodium: 138 mmol/L (ref 135–145)
TOTAL PROTEIN: 7.1 g/dL (ref 6.5–8.1)

## 2017-08-11 LAB — CBC WITH DIFFERENTIAL/PLATELET
BASOS ABS: 0.1 10*3/uL (ref 0.0–0.1)
Basophils Relative: 1 %
Eosinophils Absolute: 0.3 10*3/uL (ref 0.0–0.7)
Eosinophils Relative: 4 %
HEMATOCRIT: 46.1 % — AB (ref 36.0–46.0)
HEMOGLOBIN: 15.4 g/dL — AB (ref 12.0–15.0)
LYMPHS PCT: 18 %
Lymphs Abs: 1.2 10*3/uL (ref 0.7–4.0)
MCH: 32.1 pg (ref 26.0–34.0)
MCHC: 33.4 g/dL (ref 30.0–36.0)
MCV: 96 fL (ref 78.0–100.0)
MONO ABS: 0.6 10*3/uL (ref 0.1–1.0)
Monocytes Relative: 9 %
NEUTROS ABS: 4.7 10*3/uL (ref 1.7–7.7)
Neutrophils Relative %: 68 %
Platelets: 257 10*3/uL (ref 150–400)
RBC: 4.8 MIL/uL (ref 3.87–5.11)
RDW: 14.1 % (ref 11.5–15.5)
WBC: 6.8 10*3/uL (ref 4.0–10.5)

## 2017-08-11 LAB — ABO/RH: ABO/RH(D): O POS

## 2017-08-11 NOTE — Pre-Procedure Instructions (Signed)
Dr. Seward Speck reviewed EKG 08/11/17 and compared it to the EKG requested from Dr. Noland Fordyce office 2015.  EKG remains stable from previous EKG, no new orders received at this time.

## 2017-08-11 NOTE — Consult Note (Signed)
[place] Nurse ostomy consult note  [place] Nurse requested for preoperative stoma site marking by Dr. Dema Severin.  Discussed surgical procedure and stoma creation with patient.  Explained role of the [place] nurse team. Patient understands that an ostomy is not part of the planned surgery and that it is only if absolutely needed intraoperatively. She declines educational material regarding an ostomy at this time.  Patient reports an unbilical hernia and a rectal prolapse that she has not discussed with Dr. Dema Severin.  She is asking me if he might be able to repair these two issues on Monday.  I encourage the patient to contact Dr. Orest Dikes office today.   Examined patient sitting and standing in order to place the marking in the patient's visual field, away from any creases or abdominal contour issues and within the rectus muscle.    Marked for colostomy in the LLQ  7 cm to the left of the superior aspect of the umbilical hernia and 2.5 cm above the superior aspect of the umbilical hernia.  Patient's abdomen cleansed x2 with CHG wipes at site markings, allowed to air dry prior to marking.Covered mark with thin film transparent dressing to preserve mark until date of surgery (Monday, August 14, 2017).   [place] nursing team will  Follow and will remain available to this patient, the nursing, surgical and medical teams if an ostomy is created.    Thank you for the opportunity to meet this lovely woman.  Maudie Flakes, MSN, RN, [place], [facility]  Pager# [phone]

## 2017-08-12 NOTE — Anesthesia Preprocedure Evaluation (Deleted)
Anesthesia Evaluation  Patient identified by MRN, date of birth, ID band Patient awake    Reviewed: Allergy & Precautions, NPO status , Patient's Chart, lab work & pertinent test results, reviewed documented beta blocker date and time   Airway Mallampati: II  TM Distance: >3 FB Neck ROM: Full    Dental  (+) Dental Advisory Given   Pulmonary neg pulmonary ROS,    Pulmonary exam normal breath sounds clear to auscultation       Cardiovascular hypertension, Pt. on home beta blockers and Pt. on medications Normal cardiovascular exam Rhythm:Regular Rate:Normal  EKG - Anterior and inferoseptal Q waves, 1st deg AVB   Neuro/Psych Anxiety negative neurological ROS     GI/Hepatic Neg liver ROS, Rectosigmoid cancer   Endo/Other  negative endocrine ROS  Renal/GU negative Renal ROS  negative genitourinary   Musculoskeletal negative musculoskeletal ROS (+)   Abdominal   Peds  Hematology negative hematology ROS (+)   Anesthesia Other Findings   Reproductive/Obstetrics                             Anesthesia Physical Anesthesia Plan  ASA: III  Anesthesia Plan: General   Post-op Pain Management:    Induction: Intravenous  PONV Risk Score and Plan: 4 or greater and Treatment may vary due to age or medical condition, Ondansetron and Dexamethasone  Airway Management Planned: Oral ETT  Additional Equipment: None  Intra-op Plan:   Post-operative Plan: Extubation in OR  Informed Consent: I have reviewed the patients History and Physical, chart, labs and discussed the procedure including the risks, benefits and alternatives for the proposed anesthesia with the patient or authorized representative who has indicated his/her understanding and acceptance.   Dental advisory given  Plan Discussed with: CRNA  Anesthesia Plan Comments: (ERAS pathway including low dose ketamine and lidocaine infusion)         Anesthesia Quick Evaluation

## 2017-08-14 ENCOUNTER — Inpatient Hospital Stay (HOSPITAL_COMMUNITY): Payer: Medicare Other

## 2017-08-14 ENCOUNTER — Inpatient Hospital Stay (HOSPITAL_COMMUNITY): Payer: Medicare Other | Admitting: Anesthesiology

## 2017-08-14 ENCOUNTER — Encounter (HOSPITAL_COMMUNITY)
Admission: RE | Disposition: A | Discharge status: Home / Self Care | Payer: Self-pay | Source: Ambulatory Visit | Attending: Surgery

## 2017-08-14 ENCOUNTER — Inpatient Hospital Stay (HOSPITAL_COMMUNITY)
Admission: RE | Admit: 2017-08-14 | Discharge: 2017-08-16 | DRG: 331 | Disposition: A | Discharge status: Home / Self Care | Payer: Medicare Other | Source: Ambulatory Visit | Attending: Surgery | Admitting: Surgery

## 2017-08-14 ENCOUNTER — Encounter (HOSPITAL_COMMUNITY): Payer: Self-pay | Admitting: *Deleted

## 2017-08-14 ENCOUNTER — Other Ambulatory Visit: Payer: Self-pay

## 2017-08-14 DIAGNOSIS — I2510 Atherosclerotic heart disease of native coronary artery without angina pectoris: Secondary | ICD-10-CM | POA: Diagnosis present

## 2017-08-14 DIAGNOSIS — Z91048 Other nonmedicinal substance allergy status: Secondary | ICD-10-CM | POA: Diagnosis not present

## 2017-08-14 DIAGNOSIS — Z9842 Cataract extraction status, left eye: Secondary | ICD-10-CM | POA: Diagnosis not present

## 2017-08-14 DIAGNOSIS — I10 Essential (primary) hypertension: Secondary | ICD-10-CM | POA: Diagnosis present

## 2017-08-14 DIAGNOSIS — Z9841 Cataract extraction status, right eye: Secondary | ICD-10-CM

## 2017-08-14 DIAGNOSIS — K623 Rectal prolapse: Secondary | ICD-10-CM | POA: Diagnosis present

## 2017-08-14 DIAGNOSIS — Z885 Allergy status to narcotic agent status: Secondary | ICD-10-CM

## 2017-08-14 DIAGNOSIS — K449 Diaphragmatic hernia without obstruction or gangrene: Secondary | ICD-10-CM | POA: Diagnosis present

## 2017-08-14 DIAGNOSIS — C19 Malignant neoplasm of rectosigmoid junction: Secondary | ICD-10-CM | POA: Diagnosis present

## 2017-08-14 DIAGNOSIS — K429 Umbilical hernia without obstruction or gangrene: Secondary | ICD-10-CM | POA: Diagnosis present

## 2017-08-14 DIAGNOSIS — R634 Abnormal weight loss: Secondary | ICD-10-CM | POA: Diagnosis present

## 2017-08-14 DIAGNOSIS — N200 Calculus of kidney: Secondary | ICD-10-CM | POA: Diagnosis present

## 2017-08-14 DIAGNOSIS — Z79899 Other long term (current) drug therapy: Secondary | ICD-10-CM | POA: Diagnosis not present

## 2017-08-14 DIAGNOSIS — Z6827 Body mass index (BMI) 27.0-27.9, adult: Secondary | ICD-10-CM

## 2017-08-14 HISTORY — PX: CYSTOSCOPY WITH STENT PLACEMENT: SHX5790

## 2017-08-14 HISTORY — PX: UMBILICAL HERNIA REPAIR: SHX196

## 2017-08-14 HISTORY — PX: FLEXIBLE SIGMOIDOSCOPY: SHX5431

## 2017-08-14 HISTORY — PX: LAPAROSCOPIC LOW ANTERIOR RESECTION: SHX5904

## 2017-08-14 LAB — CBC
HCT: 41.4 % (ref 36.0–46.0)
HEMOGLOBIN: 14.3 g/dL (ref 12.0–15.0)
MCH: 32.7 pg (ref 26.0–34.0)
MCHC: 34.5 g/dL (ref 30.0–36.0)
MCV: 94.7 fL (ref 78.0–100.0)
PLATELETS: 187 10*3/uL (ref 150–400)
RBC: 4.37 MIL/uL (ref 3.87–5.11)
RDW: 13.9 % (ref 11.5–15.5)
WBC: 10.7 10*3/uL — ABNORMAL HIGH (ref 4.0–10.5)

## 2017-08-14 LAB — CREATININE, SERUM
CREATININE: 0.8 mg/dL (ref 0.44–1.00)
GFR calc Af Amer: >60 mL/min (ref >60)

## 2017-08-14 LAB — TYPE AND SCREEN
ABO/RH(D): O POS
Antibody Screen: NEGATIVE

## 2017-08-14 SURGERY — RESECTION, RECTUM, LOW ANTERIOR, LAPAROSCOPIC
Anesthesia: General | Site: Ureter

## 2017-08-14 MED ORDER — HYDROMORPHONE HCL 1 MG/ML IJ SOLN: 0.5000 mg | INTRAMUSCULAR | Status: DC | PRN

## 2017-08-14 MED ORDER — KETOROLAC TROMETHAMINE 15 MG/ML IJ SOLN
15.0000 mg | Freq: Four times a day (QID) | INTRAMUSCULAR | Status: DC
  Administered 2017-08-14 – 2017-08-15 (×7): 15 mg via INTRAVENOUS
  Filled 2017-08-14 (×5): qty 1

## 2017-08-14 MED ORDER — PROPOFOL 10 MG/ML IV BOLUS
INTRAVENOUS | Status: AC
  Filled 2017-08-14: qty 20

## 2017-08-14 MED ORDER — LACTATED RINGERS IV SOLN
INTRAVENOUS | Status: DC | PRN
  Administered 2017-08-14 (×2): via INTRAVENOUS

## 2017-08-14 MED ORDER — ROCURONIUM BROMIDE 50 MG/5ML IV SOSY
PREFILLED_SYRINGE | INTRAVENOUS | Status: AC
  Filled 2017-08-14: qty 5

## 2017-08-14 MED ORDER — ONDANSETRON HCL 4 MG/2ML IJ SOLN
INTRAMUSCULAR | Status: AC
  Filled 2017-08-14: qty 2

## 2017-08-14 MED ORDER — ROCURONIUM BROMIDE 100 MG/10ML IV SOLN
INTRAVENOUS | Status: DC | PRN
  Administered 2017-08-14: 50 mg via INTRAVENOUS
  Administered 2017-08-14: 10 mg via INTRAVENOUS

## 2017-08-14 MED ORDER — HYDROCHLOROTHIAZIDE 12.5 MG PO CAPS
12.5000 mg | ORAL_CAPSULE | Freq: Every day | ORAL | Status: DC
  Administered 2017-08-15: 12.5 mg via ORAL
  Filled 2017-08-14: qty 1

## 2017-08-14 MED ORDER — LIDOCAINE 2% (20 MG/ML) 5 ML SYRINGE
INTRAMUSCULAR | Status: DC | PRN
  Administered 2017-08-14: 1 mg/kg/h via INTRAVENOUS

## 2017-08-14 MED ORDER — ALVIMOPAN 12 MG PO CAPS: 12.0000 mg | ORAL_CAPSULE | Freq: Two times a day (BID) | ORAL | Status: DC

## 2017-08-14 MED ORDER — LOSARTAN POTASSIUM 50 MG PO TABS
100.0000 mg | ORAL_TABLET | Freq: Every day | ORAL | Status: DC
  Administered 2017-08-15: 100 mg via ORAL
  Filled 2017-08-14: qty 2

## 2017-08-14 MED ORDER — FENTANYL CITRATE (PF) 100 MCG/2ML IJ SOLN
INTRAMUSCULAR | Status: DC | PRN
  Administered 2017-08-14: 100 ug via INTRAVENOUS
  Administered 2017-08-14: 50 ug via INTRAVENOUS

## 2017-08-14 MED ORDER — METOPROLOL SUCCINATE ER 50 MG PO TB24
100.0000 mg | ORAL_TABLET | Freq: Every day | ORAL | Status: DC
  Administered 2017-08-15: 100 mg via ORAL
  Filled 2017-08-14: qty 2

## 2017-08-14 MED ORDER — ONDANSETRON HCL 4 MG PO TABS: 4.0000 mg | ORAL_TABLET | Freq: Four times a day (QID) | ORAL | Status: DC | PRN

## 2017-08-14 MED ORDER — HYDROMORPHONE HCL 1 MG/ML IJ SOLN: 0.2500 mg | INTRAMUSCULAR | Status: DC | PRN

## 2017-08-14 MED ORDER — LORAZEPAM 0.5 MG PO TABS: 0.5000 mg | ORAL_TABLET | Freq: Two times a day (BID) | ORAL | Status: DC | PRN

## 2017-08-14 MED ORDER — GABAPENTIN 300 MG PO CAPS
300.0000 mg | ORAL_CAPSULE | ORAL | Status: AC
  Administered 2017-08-14: 300 mg via ORAL
  Filled 2017-08-14: qty 1

## 2017-08-14 MED ORDER — MEPERIDINE HCL 50 MG/ML IJ SOLN: 6.2500 mg | INTRAMUSCULAR | Status: DC | PRN

## 2017-08-14 MED ORDER — ACETAMINOPHEN 500 MG PO TABS
1000.0000 mg | ORAL_TABLET | ORAL | Status: AC
  Administered 2017-08-14: 1000 mg via ORAL
  Filled 2017-08-14: qty 2

## 2017-08-14 MED ORDER — DIPHENHYDRAMINE HCL 12.5 MG/5ML PO ELIX: 12.5000 mg | ORAL_SOLUTION | Freq: Four times a day (QID) | ORAL | Status: DC | PRN

## 2017-08-14 MED ORDER — SUGAMMADEX SODIUM 200 MG/2ML IV SOLN
INTRAVENOUS | Status: AC
  Filled 2017-08-14: qty 2

## 2017-08-14 MED ORDER — HEPARIN SODIUM (PORCINE) 5000 UNIT/ML IJ SOLN
5000.0000 [IU] | Freq: Once | INTRAMUSCULAR | Status: AC
  Administered 2017-08-14: 5000 [IU] via SUBCUTANEOUS
  Filled 2017-08-14: qty 1

## 2017-08-14 MED ORDER — DIPHENHYDRAMINE HCL 50 MG/ML IJ SOLN: 12.5000 mg | Freq: Four times a day (QID) | INTRAMUSCULAR | Status: DC | PRN

## 2017-08-14 MED ORDER — BUPIVACAINE-EPINEPHRINE 0.25% -1:200000 IJ SOLN
INTRAMUSCULAR | Status: AC
  Filled 2017-08-14: qty 1

## 2017-08-14 MED ORDER — 0.9 % SODIUM CHLORIDE (POUR BTL) OPTIME
TOPICAL | Status: DC | PRN
  Administered 2017-08-14: 2000 mL

## 2017-08-14 MED ORDER — CEFOTETAN DISODIUM-DEXTROSE 2-2.08 GM-%(50ML) IV SOLR
2.0000 g | INTRAVENOUS | Status: AC
  Administered 2017-08-14: 2 g via INTRAVENOUS

## 2017-08-14 MED ORDER — SODIUM CHLORIDE 0.9 % IR SOLN
Status: DC | PRN
  Administered 2017-08-14: 3000 mL

## 2017-08-14 MED ORDER — ALVIMOPAN 12 MG PO CAPS
12.0000 mg | ORAL_CAPSULE | ORAL | Status: AC
  Administered 2017-08-14: 12 mg via ORAL
  Filled 2017-08-14: qty 1

## 2017-08-14 MED ORDER — GABAPENTIN 300 MG PO CAPS
300.0000 mg | ORAL_CAPSULE | Freq: Two times a day (BID) | ORAL | Status: DC
  Administered 2017-08-14 – 2017-08-15 (×3): 300 mg via ORAL
  Filled 2017-08-14: qty 1
  Filled 2017-08-14: qty 3
  Filled 2017-08-14: qty 1

## 2017-08-14 MED ORDER — KETOROLAC TROMETHAMINE 15 MG/ML IJ SOLN
INTRAMUSCULAR | Status: AC
  Filled 2017-08-14: qty 1

## 2017-08-14 MED ORDER — TRAMADOL HCL 50 MG PO TABS: 50.0000 mg | ORAL_TABLET | Freq: Four times a day (QID) | ORAL | Status: DC | PRN

## 2017-08-14 MED ORDER — CHLORHEXIDINE GLUCONATE CLOTH 2 % EX PADS: 6.0000 | MEDICATED_PAD | Freq: Once | CUTANEOUS | Status: DC

## 2017-08-14 MED ORDER — ONDANSETRON HCL 4 MG/2ML IJ SOLN: 4.0000 mg | Freq: Once | INTRAMUSCULAR | Status: DC | PRN

## 2017-08-14 MED ORDER — BUPIVACAINE-EPINEPHRINE 0.25% -1:200000 IJ SOLN
INTRAMUSCULAR | Status: DC | PRN
  Administered 2017-08-14: 50 mL

## 2017-08-14 MED ORDER — METOPROLOL SUCCINATE ER 100 MG PO TB24
100.0000 mg | ORAL_TABLET | Freq: Every day | ORAL | Status: DC
  Administered 2017-08-14: 100 mg via ORAL
  Filled 2017-08-14: qty 1

## 2017-08-14 MED ORDER — HEPARIN SODIUM (PORCINE) 5000 UNIT/ML IJ SOLN
5000.0000 [IU] | Freq: Three times a day (TID) | INTRAMUSCULAR | Status: DC
Start: 2017-08-14 — End: 2017-08-16
  Administered 2017-08-14 – 2017-08-16 (×5): 5000 [IU] via SUBCUTANEOUS
  Filled 2017-08-14 (×5): qty 1

## 2017-08-14 MED ORDER — FENTANYL CITRATE (PF) 250 MCG/5ML IJ SOLN
INTRAMUSCULAR | Status: AC
  Filled 2017-08-14: qty 5

## 2017-08-14 MED ORDER — AMLODIPINE BESYLATE 5 MG PO TABS
5.0000 mg | ORAL_TABLET | Freq: Every day | ORAL | Status: DC
  Administered 2017-08-15: 5 mg via ORAL
  Filled 2017-08-14: qty 1

## 2017-08-14 MED ORDER — LIDOCAINE HCL (CARDIAC) 20 MG/ML IV SOLN
INTRAVENOUS | Status: DC | PRN
  Administered 2017-08-14: 40 mg via INTRAVENOUS

## 2017-08-14 MED ORDER — PHENYLEPHRINE HCL 10 MG/ML IJ SOLN
INTRAMUSCULAR | Status: DC | PRN
  Administered 2017-08-14: 80 ug via INTRAVENOUS

## 2017-08-14 MED ORDER — PROPOFOL 10 MG/ML IV BOLUS
INTRAVENOUS | Status: DC | PRN
  Administered 2017-08-14: 130 mg via INTRAVENOUS

## 2017-08-14 MED ORDER — ACETAMINOPHEN 500 MG PO TABS
1000.0000 mg | ORAL_TABLET | Freq: Four times a day (QID) | ORAL | Status: DC
  Administered 2017-08-14 – 2017-08-15 (×3): 1000 mg via ORAL
  Filled 2017-08-14 (×2): qty 2

## 2017-08-14 MED ORDER — ONDANSETRON HCL 4 MG/2ML IJ SOLN: 4.0000 mg | Freq: Four times a day (QID) | INTRAMUSCULAR | Status: DC | PRN

## 2017-08-14 MED ORDER — EPHEDRINE SULFATE 50 MG/ML IJ SOLN
INTRAMUSCULAR | Status: DC | PRN
  Administered 2017-08-14: 10 mg via INTRAVENOUS
  Administered 2017-08-14 (×3): 5 mg via INTRAVENOUS

## 2017-08-14 MED ORDER — LOSARTAN POTASSIUM-HCTZ 100-12.5 MG PO TABS: 1.0000 | ORAL_TABLET | Freq: Every day | ORAL | Status: DC

## 2017-08-14 MED ORDER — METOPROLOL TARTRATE 5 MG/5ML IV SOLN: 5.0000 mg | Freq: Four times a day (QID) | INTRAVENOUS | Status: DC | PRN

## 2017-08-14 MED ORDER — SUGAMMADEX SODIUM 200 MG/2ML IV SOLN
INTRAVENOUS | Status: DC | PRN
  Administered 2017-08-14: 150 mg via INTRAVENOUS

## 2017-08-14 MED ORDER — DEXAMETHASONE SODIUM PHOSPHATE 10 MG/ML IJ SOLN
INTRAMUSCULAR | Status: AC
  Filled 2017-08-14: qty 1

## 2017-08-14 MED ORDER — CEFOTETAN DISODIUM-DEXTROSE 2-2.08 GM-%(50ML) IV SOLR
INTRAVENOUS | Status: AC
  Filled 2017-08-14: qty 50

## 2017-08-14 MED ORDER — ALUM & MAG HYDROXIDE-SIMETH 200-200-20 MG/5ML PO SUSP: 30.0000 mL | Freq: Four times a day (QID) | ORAL | Status: DC | PRN

## 2017-08-14 MED ORDER — LIDOCAINE HCL 2 % IJ SOLN
INTRAMUSCULAR | Status: AC
Start: 2017-08-14 — End: 2017-08-14
  Filled 2017-08-14: qty 20

## 2017-08-14 MED ORDER — LACTATED RINGERS IV SOLN
INTRAVENOUS | Status: DC
  Administered 2017-08-14 (×2): via INTRAVENOUS

## 2017-08-14 MED ORDER — LACTATED RINGERS IR SOLN
Status: DC | PRN
  Administered 2017-08-14: 1000 mL

## 2017-08-14 SURGICAL SUPPLY — 78 items
ADAPTER GOLDBERG URETERAL (ADAPTER) ×5 IMPLANT
ADH SKN CLS APL DERMABOND .7 (GAUZE/BANDAGES/DRESSINGS) ×2
ADPR CATH 15X14FR FL DRN BG (ADAPTER) ×2
APPLIER CLIP 5 13 M/L LIGAMAX5 (MISCELLANEOUS)
APPLIER CLIP ROT 10 11.4 M/L (STAPLE)
APR CLP MED LRG 5 ANG JAW (MISCELLANEOUS)
BAG URO CATCHER STRL LF (MISCELLANEOUS) ×5 IMPLANT
BLADE EXTENDED COATED 6.5IN (ELECTRODE) IMPLANT
CABLE HIGH FREQUENCY MONO STRZ (ELECTRODE) ×5 IMPLANT
CATH INTERMIT  6FR 70CM (CATHETERS) ×10 IMPLANT
CATH MUSHROOM 28FR (CATHETERS) IMPLANT
CATH MUSHROOM 30FR (CATHETERS) IMPLANT
CELLS DAT CNTRL 66122 CELL SVR (MISCELLANEOUS) IMPLANT
CLIP APPLIE 5 13 M/L LIGAMAX5 (MISCELLANEOUS) IMPLANT
CLIP APPLIE ROT 10 11.4 M/L (STAPLE) IMPLANT
CLOTH BEACON ORANGE TIMEOUT ST (SAFETY) ×5 IMPLANT
COVER FOOTSWITCH UNIV (MISCELLANEOUS) IMPLANT
COVER SURGICAL LIGHT HANDLE (MISCELLANEOUS) ×5 IMPLANT
DECANTER SPIKE VIAL GLASS SM (MISCELLANEOUS) ×5 IMPLANT
DERMABOND ADVANCED (GAUZE/BANDAGES/DRESSINGS) ×1
DERMABOND ADVANCED .7 DNX12 (GAUZE/BANDAGES/DRESSINGS) ×4 IMPLANT
DISSECTOR BLUNT TIP ENDO 5MM (MISCELLANEOUS) IMPLANT
DRAIN CHANNEL 19F RND (DRAIN) IMPLANT
DRAPE SURG IRRIG POUCH 19X23 (DRAPES) ×5 IMPLANT
DRSG OPSITE POSTOP 4X10 (GAUZE/BANDAGES/DRESSINGS) IMPLANT
DRSG OPSITE POSTOP 4X6 (GAUZE/BANDAGES/DRESSINGS) ×5 IMPLANT
DRSG OPSITE POSTOP 4X8 (GAUZE/BANDAGES/DRESSINGS) IMPLANT
ELECT REM PT RETURN 15FT ADLT (MISCELLANEOUS) ×5 IMPLANT
EVACUATOR SILICONE 100CC (DRAIN) IMPLANT
GAUZE SPONGE 4X4 12PLY STRL (GAUZE/BANDAGES/DRESSINGS) IMPLANT
GLOVE BIO SURGEON STRL SZ7.5 (GLOVE) ×10 IMPLANT
GLOVE BIOGEL M STRL SZ7.5 (GLOVE) ×5 IMPLANT
GLOVE INDICATOR 8.0 STRL GRN (GLOVE) ×10 IMPLANT
GOWN STRL REUS W/TWL LRG LVL3 (GOWN DISPOSABLE) ×10 IMPLANT
GOWN STRL REUS W/TWL XL LVL3 (GOWN DISPOSABLE) ×20 IMPLANT
GUIDEWIRE STR DUAL SENSOR (WIRE) ×5 IMPLANT
HOLDER FOLEY CATH W/STRAP (MISCELLANEOUS) ×5 IMPLANT
LIGASURE IMPACT 36 18CM CVD LR (INSTRUMENTS) IMPLANT
MANIFOLD NEPTUNE II (INSTRUMENTS) ×5 IMPLANT
PACK COLON (CUSTOM PROCEDURE TRAY) ×5 IMPLANT
PACK CYSTO (CUSTOM PROCEDURE TRAY) ×5 IMPLANT
PAD POSITIONING PINK XL (MISCELLANEOUS) ×5 IMPLANT
PORT LAP GEL ALEXIS MED 5-9CM (MISCELLANEOUS) ×5 IMPLANT
RELOAD STAPLER BLUE 60MM (STAPLE) ×4 IMPLANT
RTRCTR WOUND ALEXIS 18CM MED (MISCELLANEOUS)
SCISSORS LAP 5X35 DISP (ENDOMECHANICALS) ×5 IMPLANT
SEALER TISSUE G2 STRG ARTC 35C (ENDOMECHANICALS) ×5 IMPLANT
SET IRRIG TUBING LAPAROSCOPIC (IRRIGATION / IRRIGATOR) ×5 IMPLANT
SHEARS HARMONIC ACE PLUS 36CM (ENDOMECHANICALS) IMPLANT
SLEEVE ADV FIXATION 5X100MM (TROCAR) ×10 IMPLANT
SPONGE DRAIN TRACH 4X4 STRL 2S (GAUZE/BANDAGES/DRESSINGS) IMPLANT
STAPLER CIRC ILS CVD 33MM 37CM (STAPLE) ×5 IMPLANT
STAPLER ECHELON LONG 60 440 (INSTRUMENTS) ×5 IMPLANT
STAPLER RELOAD BLUE 60MM (STAPLE) ×5
STAPLER VISISTAT 35W (STAPLE) IMPLANT
SUT ETHILON 3 0 PS 1 (SUTURE) IMPLANT
SUT PDS AB 1 CTX 36 (SUTURE) IMPLANT
SUT PDS AB 1 TP1 96 (SUTURE) IMPLANT
SUT PROLENE 2 0 KS (SUTURE) ×5 IMPLANT
SUT PROLENE 2 0 SH DA (SUTURE) ×5 IMPLANT
SUT SILK 2 0 (SUTURE) ×2
SUT SILK 2 0 SH CR/8 (SUTURE) ×5 IMPLANT
SUT SILK 2-0 18XBRD TIE 12 (SUTURE) ×4 IMPLANT
SUT SILK 3 0 (SUTURE) ×2
SUT SILK 3 0 SH CR/8 (SUTURE) ×5 IMPLANT
SUT SILK 3-0 18XBRD TIE 12 (SUTURE) ×4 IMPLANT
SUT VIC AB 3-0 SH 18 (SUTURE) IMPLANT
SUT VICRYL 2 0 18  UND BR (SUTURE) ×1
SUT VICRYL 2 0 18 UND BR (SUTURE) ×4 IMPLANT
SYS LAPSCP GELPORT 120MM (MISCELLANEOUS)
SYSTEM LAPSCP GELPORT 120MM (MISCELLANEOUS) IMPLANT
TOWEL OR 17X26 10 PK STRL BLUE (TOWEL DISPOSABLE) IMPLANT
TOWEL OR NON WOVEN STRL DISP B (DISPOSABLE) ×5 IMPLANT
TRAY FOLEY BAG SILVER LF 14FR (CATHETERS) IMPLANT
TROCAR ADV FIXATION 5X100MM (TROCAR) ×5 IMPLANT
TROCAR XCEL BLUNT TIP 100MML (ENDOMECHANICALS) ×5 IMPLANT
TUBING CONNECTING 10 (TUBING) ×5 IMPLANT
TUBING INSUF HEATED (TUBING) ×5 IMPLANT

## 2017-08-14 NOTE — Anesthesia Postprocedure Evaluation (Signed)
Anesthesia Post Note  Patient: Tracy Blackwell  Procedure(s) Performed: LAPAROSCOPIC ASSISTED ANTERIOR RESECTION (N/A ) FLEXIBLE SIGMOIDOSCOPY (N/A Rectum) UMBILICAL HERNIA REPAIR (N/A Abdomen) CYSTOSCOPY WITH BILATERAL STENT PLACEMENT (Bilateral Ureter)     Patient location during evaluation: PACU Anesthesia Type: General Level of consciousness: awake and alert Pain management: pain level controlled Vital Signs Assessment: post-procedure vital signs reviewed and stable Respiratory status: spontaneous breathing, nonlabored ventilation, respiratory function stable and patient connected to nasal cannula oxygen Cardiovascular status: blood pressure returned to baseline and stable Postop Assessment: no apparent nausea or vomiting Anesthetic complications: no    Last Vitals:  Vitals:   08/14/17 1245 08/14/17 1345  BP: 130/75 132/68  Pulse: 67 70  Resp: 14 14  Temp:  36.6 C  SpO2: 98% 98%    Last Pain:  Vitals:   08/14/17 1345  TempSrc: Oral  PainSc:                  Ader Fritze DAVID

## 2017-08-14 NOTE — Anesthesia Preprocedure Evaluation (Signed)
Anesthesia Evaluation  Patient identified by MRN, date of birth, ID band Patient awake    Reviewed: Allergy & Precautions, NPO status , Patient's Chart, lab work & pertinent test results  Airway Mallampati: I  TM Distance: >3 FB Neck ROM: Full    Dental   Pulmonary    Pulmonary exam normal        Cardiovascular hypertension, Pt. on medications Normal cardiovascular exam     Neuro/Psych    GI/Hepatic   Endo/Other    Renal/GU      Musculoskeletal   Abdominal   Peds  Hematology   Anesthesia Other Findings   Reproductive/Obstetrics                             Anesthesia Physical Anesthesia Plan  ASA: II  Anesthesia Plan: General   Post-op Pain Management:    Induction: Intravenous  PONV Risk Score and Plan: 3 and Ondansetron, Dexamethasone and Treatment may vary due to age or medical condition  Airway Management Planned: Oral ETT  Additional Equipment:   Intra-op Plan:   Post-operative Plan: Extubation in OR  Informed Consent: I have reviewed the patients History and Physical, chart, labs and discussed the procedure including the risks, benefits and alternatives for the proposed anesthesia with the patient or authorized representative who has indicated his/her understanding and acceptance.     Plan Discussed with: CRNA and Surgeon  Anesthesia Plan Comments:         Anesthesia Quick Evaluation

## 2017-08-14 NOTE — Op Note (Signed)
Operative Note  Preoperative diagnosis:  1.  Locally advanced rectosigmoid cancer  Postoperative diagnosis: 1.  Same  Procedure(s): 1.  Cystoscopy 2.  Bilateral ureteral catheter placement  Surgeon: Ellison Hughs, MD  Assistants:  None  Anesthesia:  Gen endotracheal  Complications:  None  EBL:  <5 mL  Specimens: 1. None  Drains/Catheters: 1.  Bilateral 6 French ureteral catheters 2.  Foley catheter  Intraoperative findings:  Bilateral ureteral catheters in good position on fluoro following placement  Indication:  Tracy Blackwell is a 83 y.o. female with locally advanced rectosigmoid cancer.  She is here today for an AR/LAR with Dr. Dema Severin.  Urology was consulted to place ureteral catheters to aid in identification of the ureters during dissection of the colon mass  Description of procedure:  After informed consent was obtained, the patient was brought to the operating room and general LMA anesthesia was administered. The patient was then placed in the dorsolithotomy position and prepped and draped in usual sterile fashion. A timeout was performed. A 21 French rigid cystoscope was then inserted into the urethral meatus and advanced into the bladder under direct vision. A complete bladder survey revealed no intravesical pathology.  A wire was then used to intubate the right ureteral orifice and was advanced up to the right renal pelvis, under fluoroscopic guidance. A 6 French ureteral catheter was then advanced over the wire and into position within the proximal right collecting system. The left ureteral catheter was then placed in the identical fashion. A 14 French Foley catheter was then placed in the bladder and set to gravity drainage. The ureteral catheters were also placed to drainage and secured to the Foley.  Plan:  Her ureteral catheters and Foley catheter will be removed at the discretion of Dr. Dema Severin.

## 2017-08-14 NOTE — Interval H&P Note (Signed)
History and Physical Interval Note:  08/14/2017 7:32 AM  Tracy Blackwell  has presented today for surgery, with the diagnosis of Rectosigmoid cancer  The various methods of treatment have been discussed with the patient and family. After consideration of risks, benefits and other options for treatment, the patient has consented to  Procedure(s): [place] (N/A) POSSIBLE OSTOMY (N/A) CYSTOSCOPY WITH BILATERAL STENT PLACEMENT (Bilateral) as a surgical intervention .  The patient's history has been reviewed, patient examined, no change in status, stable for surgery.  I have reviewed the patient's chart and labs.  Questions were answered to the patient's satisfaction.     Conception Oms Anasia Agro

## 2017-08-14 NOTE — H&P (Signed)
Urology Preoperative H&P   Chief Complaint: Colon cancer  History of Present Illness: Tracy Blackwell is a 83 y.o. female with colorectal cancer involving the sigmoid colon.  Urology has been consulted for bilateral ureteral stent placement to aide in identification of the ureters intra-operatively.  She denies any prior GU surgeries or storage/voiding issues.      Past Medical History:  Diagnosis Date  . Cancer [facility])    rectosigmoid cancer  . Hypertension   . Pneumonia    age 36 months old    Past Surgical History:  Procedure Laterality Date  . CATARACT EXTRACTION, BILATERAL    . COLONOSCOPY      Allergies:  Allergies  Allergen Reactions  . Wasp Venom Anaphylaxis  . Codeine Nausea And Vomiting    History reviewed. No pertinent family history.  Social History:  reports that  has never smoked. she has never used smokeless tobacco. She reports that she drinks alcohol. She reports that she does not use drugs.  ROS: A complete review of systems was performed.  All systems are negative except for pertinent findings as noted.  Physical Exam:  Vital signs in last 24 hours: Temp:  [97.9 F (36.6 C)] 97.9 F (36.6 C) (12/31 0606) Pulse Rate:  [78] 78 (12/31 0606) Resp:  [18] 18 (12/31 0606) BP: (145)/(77) 145/77 (12/31 0606) SpO2:  [97 %] 97 % (12/31 0606) Weight:  [68.9 kg (152 lb)] 68.9 kg (152 lb) (12/31 0556) Constitutional:  Alert and oriented, No acute distress Cardiovascular: Regular rate and rhythm, No JVD Respiratory: Normal respiratory effort, Lungs clear bilaterally GI: Abdomen is soft, nontender, nondistended, no abdominal masses GU: No CVA tenderness Lymphatic: No lymphadenopathy Neurologic: Grossly intact, no focal deficits Psychiatric: Normal mood and affect  Laboratory Data:  Recent Labs    08/11/17 1018  WBC 6.8  HGB 15.4*  HCT 46.1*  PLT 257    Recent Labs    08/11/17 1018  NA 138  K 4.5  CL 103  GLUCOSE 89  BUN 15  CALCIUM 9.5   CREATININE 0.76     No results found for this or any previous visit (from the past 24 hour(s)). No results found for this or any previous visit (from the past 240 hour(s)).  Renal Function: Recent Labs    08/11/17 1018  CREATININE 0.76   Estimated Creatinine Clearance: 50.2 mL/min (by C-G formula based on SCr of 0.76 mg/dL).  Radiologic Imaging: No results found.  I independently reviewed the above imaging studies.  Assessment and Plan Tracy Blackwell is a 83 y.o. female with colon cancer involving the sigmoid colon and is undergoing LAR with Dr. Dema Severin  -The risks, benefits and alternatives of cystoscopy with bilateral JJ stent placement was discussed with the patient.  She voices understanding and wishes to proceed.   Ellison Hughs, MD 08/14/2017, 7:28 AM  [facility] Pager: [phone]

## 2017-08-14 NOTE — Transfer of Care (Signed)
Immediate Anesthesia Transfer of Care Note  Patient: Tracy Blackwell  Procedure(s) Performed: LAPAROSCOPIC ASSISTED ANTERIOR RESECTION (N/A ) FLEXIBLE SIGMOIDOSCOPY (N/A Rectum) UMBILICAL HERNIA REPAIR (N/A Abdomen) CYSTOSCOPY WITH BILATERAL STENT PLACEMENT (Bilateral Ureter)  Patient Location: PACU  Anesthesia Type:General  Level of Consciousness: awake, alert  and oriented  Airway & Oxygen Therapy: Patient Spontanous Breathing and Patient connected to face mask oxygen  Post-op Assessment: Report given to RN and Post -op Vital signs reviewed and stable  Post vital signs: Reviewed and stable  Last Vitals:  Vitals:   08/14/17 0606  BP: (!) 145/77  Pulse: 78  Resp: 18  Temp: 36.6 C  SpO2: 97%    Last Pain: There were no vitals filed for this visit.    Patients Stated Pain Goal: 3 (16/96/78 9381)  Complications: No apparent anesthesia complications

## 2017-08-14 NOTE — H&P (Signed)
CC: Here for surgery for rectosigmoid mass  HPI: Tracy Blackwell is a very pleasant and healthy 83yo lady seen in the office for evaluation of a rectosigmoid mass found on evaluation for possible diverticulitis. Had a bout of food poisoning to 3 months ago and some vague crampy abdominal pain that subsequently resolved.  She was seen by her primary care physician and told she may have had diverticulitis and a workup was triggered.  She underwent a CAT scan of her abdomen and pelvis 07/05/17 which demonstrated: 1. 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  There is likely transmural spread with small but suspicious regional lymph nodes 2.  No evidence of distant metastasis within the abdomen 3.  Pelvic floor laxity with rectal prolapse 4.  Hiatal hernia 5.  Coronary artery atherosclerosis 6.  Fat-containing ventral abdominal wall hernia 7.  Nephrolithiasis  She was subsequently referred to [facility] for endoscopic evaluation and this was completed in early December. Near obstructing mass found at ~25cm, biopsied, adenocarcinoma. Was not able to traverse the mass. She tolerated the bowel prep well and denied n/v/bloating/distention. At the end of the prep, her stool was clear liquid. She denies any obstructive symptoms including abdominal bloating/nausea/vomiting/distention.  Since her bout of food poisoning 3 months ago she denies having any abdominal pain whatsoever.  She has a bowel movement every day to every other day and reports that her stool is soft, brown, and without blood.  She does note a bothersome rectocele that protrudes from her vagina with defecation and Valsalva.  She endorses an unintentional 20lb wt loss over the last 31yr.  PMH: Hypertension (well controlled on oral antihypertensive) PSH: No prior abdominal surgeries FHx: Denies FHx of malignancy Social: Denies use of tobacco/drugs; social alcohol use ROS: A comprehensive 10 system review of systems  was completed with the patient and pertinent findings as noted above.   Past Surgical History No pertinent past surgical history    Diagnostic Studies History Colonoscopy   within last year  Allergies Mammie Lorenzo, LPN; 42/70/6237 6:28 PM) Codeine and Related   Nausea.  Medication History Mammie Lorenzo, LPN; 31/51/7616 0:73 PM) Metoprolol Tartrate  (100MG  Tablet, Oral) Active. Medications Reconciled   Social History Mammie Lorenzo, LPN; 71/01/2693 8:54 PM) Alcohol use   Occasional alcohol use. Caffeine use   Coffee, Tea. No drug use   Tobacco use   Never smoker.  Family History Mammie Lorenzo, LPN; 62/70/3500 9:38 PM) Colon Polyps   Daughter, Son. Hypertension   Daughter, Son.  Other Problems Mammie Lorenzo, LPN; 18/29/9371 6:96 PM) Diverticulosis   High blood pressure      Review of Systems General Present- Appetite Loss and Weight Loss. Not Present- Chills, Fatigue, Fever, Night Sweats and Weight Gain. HEENT Present- Wears glasses/contact lenses. Not Present- Earache, Hearing Loss, Hoarseness, Nose Bleed, Oral Ulcers, Ringing in the Ears, Seasonal Allergies, Sinus Pain, Sore Throat, Visual Disturbances and Yellow Eyes. Respiratory Not Present- Bloody sputum, Chronic Cough, Difficulty Breathing, Snoring and Wheezing. Breast Not Present- Breast Mass, Breast Pain, Nipple Discharge and Skin Changes. Cardiovascular Not Present- Chest Pain, Difficulty Breathing Lying Down, Leg Cramps, Palpitations, Rapid Heart Rate, Shortness of Breath and Swelling of Extremities. Gastrointestinal Present- Change in Bowel Habits. Not Present- Abdominal Pain, Bloating, Bloody Stool, Chronic diarrhea, Constipation, Difficulty Swallowing, Excessive gas, Gets full quickly at meals, Hemorrhoids, Indigestion, Nausea, Rectal Pain and Vomiting. Female Genitourinary Not Present- Frequency, Nocturia, Painful Urination, Pelvic Pain and Urgency. Musculoskeletal Not Present- Back Pain, Joint Pain, Joint  Stiffness, Muscle Pain, Muscle Weakness and Swelling of Extremities. Neurological Not Present- Decreased Memory, Fainting, Headaches, Numbness, Seizures, Tingling, Tremor, Trouble walking and Weakness. Psychiatric Not Present- Anxiety, Bipolar, Change in Sleep Pattern, Depression, Fearful and Frequent crying. Endocrine Not Present- Cold Intolerance, Excessive Hunger, Hair Changes, Heat Intolerance, Hot flashes and New Diabetes. Hematology Not Present- Easy Bruising, Excessive bleeding, Gland problems, HIV and Persistent Infections.  Vitals:   08/14/17 0556 08/14/17 0606  BP:  (!) 145/77  Pulse:  78  Resp:  18  Temp:  97.9 F (36.6 C)  SpO2:  97%  Weight: 68.9 kg (152 lb)   Height: 5\' 2"  (1.575 m)    Physical Exam Constitutional: No acute distress; conversant; no deformities Eyes: Moist conjunctiva; no lid lag; anicteric sclerae; pupils equal round and reactive to light Neck: Trachea midline; no palpable thyromegaly Lungs: Normal respiratory effort; no tactile fremitus CV: Regular rate and rhythm; no palpable thrill; no pitting edema GI: Abdomen soft, nontender, nondistended; no palpable hepatosplenomegaly. Umbilical hernia, reducible, no pain. Anorectal: No rectal prolapse with valsalva but significant rectocele. No palpable masses. Good tone. No palpable masses/lesions on DRE. MSK: Normal gait; no clubbing/cyanosis Psychiatric: Appropriate affect; alert and oriented 3 Lymphatic: No palpable cervical or axillary lymphadenopathy **A chaperone, Lennart Pall, was present for the entire physical exam    A/P COLON CANCER (C18.9) Impression: [name] is a very pleasant and otherwise healthy 83yo lady here with a likely locally advanced rectosigmoid mass/cancer -Staging scans showed no evidence of metastatic disease -The anatomy and physiology of the GI tract was discussed at length with the patient using pictures and explanation. The pathophysiology of colorectal cancer with  additional discussed. The treatment approach was discussed at length. The multidisciplinary approach to her care was also explained. Presented her case at MDT with recommendations to proceed straight to surgery - benefit of neoadjuvant therapy felt to be nil. Plan laparoscopic versus open AR/LAR based on intraoperative findings. The possible need for stoma was discussed. The need for cysto/stents was also discussed. Additionally, we discussed that the rectocele would not be addressed at this operation. -The planned procedure, material risks (including, but not limited to, pain, bleeding, infection, scarring, need for blood transfusion, damage to surrounding structures- blood vessels/nerves/viscus/organs, damage to ureter, urine leak, leak from anastomosis, need for additional procedures, need for stoma which may be permanent, hernia, recurrence, pneumonia, heart attack, stroke, death) benefits and alternatives to surgery were discussed at length. I noted a good probability that the procedure would help improve their symptoms. The patient's questions were answered to her satisfaction and she elected to proceed with surgery -Position: Lithotomy with Zenia Resides stirrups -Assist: Rodrigo Ran. Dema Severin, M.D. General and Colorectal Surgery [facility]

## 2017-08-14 NOTE — Op Note (Signed)
PATIENT: Tracy Blackwell  83 y.o. female  Patient Care Team: Tracy Dus, MD as PCP - General (Family Medicine)  PREOP DIAGNOSIS: Rectosigmoid cancer  POSTOP DIAGNOSIS: Rectosigmoid cancer  PROCEDURE:  1. Laparoscopic anterior resection with double stapled colorectal anastomosis 2. Flexible sigmoidoscopy 3. Open umbilical hernia repair  SURGEON: Tracy Mt. Dema Severin, MD  ASSISTANT: Tracy Ruff, MD -an assistant was necessary for this case due to the complexity of exposure and additionally the need for 2 surgeons for creation of the colorectal anastomosis  ANESTHESIA: General endotracheal  EBL: 20cc Total I/O In: 1000 [I.V.:1000] Out: 350 [Urine:300; Blood:50]  DRAINS: None  SPECIMEN: 1. Sigmoid colon and proximal rectum as one unit - open end is proximal 2. Distal anastomotic donut  COUNTS: Sponge, needle and instrument counts were reported correct x2  FINDINGS: No obvious metastatic disease on visceral parietal peritoneum or liver. The anastomosis is located approximately 15 cm from the anal verge.  STATEMENT OF MEDICAL NECESSITY: Tracy Blackwell is a very pleasant and healthy 83yo lady seen in the office for evaluation of a rectosigmoid mass found on evaluation for possible diverticulitis. Had a bout of food poisoning to 3 months ago and some vague crampy abdominal pain that subsequently resolved. She was seen by her primary care physician and told she may have had diverticulitis and a workup was triggered. She underwent a CAT scan of her abdomen and pelvis 07/05/17 which demonstrated a partial obstructive lesion at the rectosigmoid colon with likely transmural spread and associated small but suspicious lymph nodes.  Additionally she was noted to have a fat-containing umbilical hernia.  She underwent staging CT chest which demonstrated no evidence of metastatic disease.  Her CT abdomen pelvis additionally did not demonstrate evidence of metastatic disease.  She underwent  colonoscopy with Dr. Therisa Blackwell.  Findings notable for a large frond/villous-like lesion in the rectosigmoid colon approximately 25 cm from the anal verge that was biopsied and found to be invasive adenocarcinoma.  After counseling her in clinic she was consented for a laparoscopic versus open anterior resection.  We discussed the need for preoperative ureteral stents.  The anatomy, physiology and pathophysiology of the digestive tract was discussed with the patient using pictures and diagrams. The natural history of the disease without surgery was discussed.  I worked to give an overview of the treatment approach including the the frequent need to have multidisciplinary involvement.  I feel the risks of no intervention will lead to serious problems that outweigh the operative risks; therefore, I recommended a partial colectomy to address the pathology.  Laparoscopic & open techniques were discussed in great detail. The possible need for stoma was also discussed. The material risks (including but not limited to bleeding, infection, scarring, abscess, leak, need for additional procedures, possible ostomy, hernia, heart attack, stroke and death, and other risks), benefits and alternatives were discussed.  I noted a good likelihood this will help address the problem.   Goals of post-operative recovery were discussed as well. The patient expressed understanding and wished to proceed with surgery.  NARRATIVE: Informed consent was verified. The patient was taken to the operating room, placed supine on the operating table and SCD's were applied. General endotracheal anesthesia was induced. The patient was then positioned in the lithotomy position with Allen stirrups.  Urology then scrubbed; the patient was prepped and draped for placement of ureteral stents and cystoscopy.  Antibiotics had been administered.  Cystoscopy/stents was then performed - please refer to Tracy Blackwell dictation for details regarding this  portion of  the procedure.  The patient's abdomen was then prepped and draped in the standard sterile fashion. Surgical timeout confirmed our plan.  Abdominal entry was gained using the [facility] technique.  An infraumbilical incision was made.  This was carried down to the umbilical stalk which was dissected and grasped with a Kocher clamp.   She had a large fat-containing umbilical hernia.  The omentum within the hernia sac was stuck to the peritoneal sac and was unable to be reduced so it was resected using an Enseal device.  After peritoneal entry was confirmed with an 0 Vicryl figure-of-eight stay suture was placed in the [facility] cannula inserted. The Hasson trocar was inserted into the peritoneal cavity and insufflation commenced to 49mmHg of CO2.  Camera inspection revealed no injury.  Three additional 40mm ports were carefully placed under direct laparoscopic visualization - two in the right abdomen and one in the left abdomen.  The patient was positioned in steep Trendelenburg with left side up. The omentum and small bowel was reflected cephalad.  A lateral to medial approach was utilized.  The descending colon was mobilized up to the splenic flexure along the Tracy Blackwell line of Toldt. The descending colon was able to be reflected medially to the midline.  Attention was then turned to sigmoid colon mobilization. The sigmoid colon was mobilized off the intersigmoid fossa and care was taken to protect the left ureter from injury.  The  left ureter and stent was identified and left in place along with the gonadal vessels - posterior to the mesentery.  The sigmoid mesentery was mobilized. The sigmoid colon was then elevated. The peritoneum overlying the distal branches of the IMA was scored and the peritoneum opened down to the junction of the mid and proximal rectum.  A tattoo was readily identified at the distal margin as well as the tumor proximal to this.  The IMA pedicle was identified and the overlying sigmoid colon was  elevated.  The pedicle was then tented up.  The location of the left ureter was confirmed and dissection proceeded anterior to the plane.  The entire mesentery was now above the left ureter.  The IMA pedicle was circumferentially dissected.  The IMA was then ligated and divided using the Enseal device.  The pedicle was inspected and noted to be hemostatic.  Just cephalad to this the IMV was identified and additionally dissected and circumferentially ligated again confirming we were well above the left ureter.  Attention was then turned to the rectal dissection.  The proximal portion of the rectum was elevated and the mesorectum was placed on tension.  The mesorectum out to the level of the rectum was then divided using the Enseal device and a 'spatchcock' type fashion.  Attention was then turned to dividing the rectum.  An Endo GIA blue load was used to divide the rectum.  Adequate length of colon was confirmed - the distal descending colon easily reached the proximal rectum without any tension.  Hemostasis was then again verified.   Attention was then turned to extracorporeal anastomosis.  A Pfannenstiel incision was made approximately 2 finger breadths above the pubic symphysis and carried down to the anterior rectus fascia.  The pyramidalis muscle was divided.  The anterior rectus fascia was then opened transversely.  Kocher clamps were placed on the fascia and the fibroconnective attachments to the anterior rectus fascia were taken down with electrocautery.  Hemostasis was verified.  The midline peritoneum was then incised and the peritoneal cavity  entered.  Care was taken to protect the bladder from injury during opening of the peritoneum.  An [place] wound protector was then placed.  The sigmoid colon was grasped and eviscerated from the peritoneal cavity.  The proximal and distal sites of transection were identified.  The proximal site of transection being at the level of the distal descending colon.   A  pursestring device was then applied to the distal descending colon and a 2-0 Prolene passed through the device using a Keith needle.  The specimen was then transected and passed off.  The open end of the specimen marking the proximal point of division in the staple line being the distal point of division. EEA sizers were then used and a 33 mm sizer easily fit into the distal descending colon.   A 33 mm EEA was utilized.  The anvil was placed and the pursestring tied.  Both ends were then prepared for anastomosis. The EEA stapler was then passed and the spike deployed just anterior to the staple line.  The components were mated and orientation of the proximal limb of colon confirmed there was no twisting of the colon nor mesentery. The two ends reached one another without any tension. Care was taken to ensure no other structures were included in the staple line. The anastomosis was then created.  We then turned our attention to performing a leak test.  The colon proximal to the circular anastomosis was occluded and a flexible sigmoidoscopy was performed. The pelvis was filled with sterile saline. Air insufflation via flex sig commenced and no bubbles or air leak was identified.  Both ends of the bowel at the level of the anastomosis were pink and hemostatic.  The irrigation was evacuated.  Hemostasis was verified.  The Alexis wound protector and 5 mm ports were all removed.  Attention was then turned to closing the abdomen.  The abdomen was redraped with sterile drapes.  All equipment was exchanged for clean equipment.  Gloves and gowns of all scrubbed participants were changed.  The wounds were then irrigated with sterile saline.  Attention was then turned to repairing the umbilical hernia primarily.  This was done without mesh given the fact that this was a colorectal case.  The fascia was freed of the overlying subcutaneous tissue of the umbilicus circumferentially.  The hernia sac had been excised.  The fascia  was then approximated primarily using 0 Vicryl suture.  Fingers were inserted through the Pfannenstiel incision up to the level of the umbilicus during suture placement to ensure no underlying bowel was injured.  After repair palpation of the peritoneal lining along the umbilicus confirmed nothing was within the umbilicus.  The umbilical skin was then tacked to the overlying fascia using a 3-0 Vicryl suture.  The peritoneum at the level of the Pfannenstiel incision was closed with a running 2-0 Vicryl suture.  Care was taken to again avoid any injury to the bladder.  The rectus fascia was then approximated with running #1 PDS suture.  Inspection revealed no evidence of bleeding and an intact Pfannenstiel closure.  The skin of all incision sites was closed with 4-0 Monocryl subcuticular sutures.  Dermabond was applied to all incisions and a sterile dressing placed over the Pfannenstiel incision.  The ureteral stents at the end of the case were then removed and the Foley catheter left in place.  An MD assistant was necessary for tissue manipulation, retraction and positioning due to the complexity of the case and additionally creation  of the colorectal anastomosis per hospital policies.  DISPOSITION: PACU in satisfactory condition  Of note, this dictation was performed utilizing Dragon Dictation - any errors in transcription are unintentional

## 2017-08-14 NOTE — Anesthesia Procedure Notes (Signed)
Procedure Name: Intubation Date/Time: 08/14/2017 7:50 AM Performed by: Glory Buff, CRNA Pre-anesthesia Checklist: Patient identified, Emergency Drugs available, Suction available and Patient being monitored Patient Re-evaluated:Patient Re-evaluated prior to induction Oxygen Delivery Method: Circle system utilized Preoxygenation: Pre-oxygenation with 100% oxygen Induction Type: IV induction Ventilation: Mask ventilation without difficulty Laryngoscope Size: Miller and 3 Grade View: Grade I Tube type: Oral Tube size: 7.0 mm Number of attempts: 1 Airway Equipment and Method: Stylet and Oral airway Placement Confirmation: ETT inserted through vocal cords under direct vision,  positive ETCO2 and breath sounds checked- equal and bilateral Secured at: 18 cm Tube secured with: Tape Dental Injury: Teeth and Oropharynx as per pre-operative assessment

## 2017-08-15 ENCOUNTER — Encounter (HOSPITAL_COMMUNITY): Payer: Self-pay | Admitting: Surgery

## 2017-08-15 DIAGNOSIS — K429 Umbilical hernia without obstruction or gangrene: Secondary | ICD-10-CM

## 2017-08-15 DIAGNOSIS — I10 Essential (primary) hypertension: Secondary | ICD-10-CM | POA: Diagnosis present

## 2017-08-15 LAB — CBC
HCT: 37 % (ref 36.0–46.0)
HEMOGLOBIN: 12.5 g/dL (ref 12.0–15.0)
MCH: 31.9 pg (ref 26.0–34.0)
MCHC: 33.8 g/dL (ref 30.0–36.0)
MCV: 94.4 fL (ref 78.0–100.0)
Platelets: 214 10*3/uL (ref 150–400)
RBC: 3.92 MIL/uL (ref 3.87–5.11)
RDW: 14.1 % (ref 11.5–15.5)
WBC: 11.6 10*3/uL — ABNORMAL HIGH (ref 4.0–10.5)

## 2017-08-15 LAB — BASIC METABOLIC PANEL
Anion gap: 7 (ref 5–15)
BUN: 10 mg/dL (ref 6–20)
CHLORIDE: 104 mmol/L (ref 101–111)
CO2: 26 mmol/L (ref 22–32)
CREATININE: 0.79 mg/dL (ref 0.44–1.00)
Calcium: 9 mg/dL (ref 8.9–10.3)
GFR calc Af Amer: >60 mL/min (ref >60)
GFR calc non Af Amer: >60 mL/min (ref >60)
GLUCOSE: 112 mg/dL — AB (ref 65–99)
Potassium: 3.5 mmol/L (ref 3.5–5.1)
SODIUM: 137 mmol/L (ref 135–145)

## 2017-08-15 LAB — PHOSPHORUS: Phosphorus: 3.6 mg/dL (ref 2.5–4.6)

## 2017-08-15 LAB — CEA: CEA1: 3.8 ng/mL (ref 0.0–4.7)

## 2017-08-15 LAB — MAGNESIUM: Magnesium: 1.6 mg/dL — ABNORMAL LOW (ref 1.7–2.4)

## 2017-08-15 MED ORDER — SODIUM CHLORIDE 0.9 % IV SOLN: 250.0000 mL | INTRAVENOUS | Status: DC | PRN

## 2017-08-15 MED ORDER — PSYLLIUM 95 % PO PACK
1.0000 | PACK | Freq: Every day | ORAL | Status: DC
  Filled 2017-08-15: qty 1

## 2017-08-15 MED ORDER — ADULT MULTIVITAMIN W/MINERALS CH
1.0000 | ORAL_TABLET | Freq: Every day | ORAL | Status: DC
  Administered 2017-08-15: 1 via ORAL
  Filled 2017-08-15: qty 1

## 2017-08-15 MED ORDER — HYDROCORTISONE 2.5 % RE CREA: 1.0000 "application " | TOPICAL_CREAM | Freq: Four times a day (QID) | RECTAL | Status: DC | PRN

## 2017-08-15 MED ORDER — LACTATED RINGERS IV BOLUS (SEPSIS): 1000.0000 mL | Freq: Three times a day (TID) | INTRAVENOUS | Status: DC | PRN

## 2017-08-15 MED ORDER — LIP MEDEX EX OINT
1.0000 "application " | TOPICAL_OINTMENT | Freq: Two times a day (BID) | CUTANEOUS | Status: DC
  Administered 2017-08-15 (×2): 1 via TOPICAL
  Filled 2017-08-15: qty 7

## 2017-08-15 MED ORDER — PROCHLORPERAZINE EDISYLATE 5 MG/ML IJ SOLN: 5.0000 mg | INTRAMUSCULAR | Status: DC | PRN

## 2017-08-15 MED ORDER — ACETAMINOPHEN 500 MG PO TABS
1000.0000 mg | ORAL_TABLET | Freq: Four times a day (QID) | ORAL | Status: DC | PRN
Start: 2017-08-15 — End: 2017-08-16

## 2017-08-15 MED ORDER — MAGIC MOUTHWASH
15.0000 mL | Freq: Four times a day (QID) | ORAL | Status: DC | PRN
  Filled 2017-08-15: qty 15

## 2017-08-15 MED ORDER — GUAIFENESIN-DM 100-10 MG/5ML PO SYRP: 10.0000 mL | ORAL_SOLUTION | ORAL | Status: DC | PRN

## 2017-08-15 MED ORDER — METHOCARBAMOL 1000 MG/10ML IJ SOLN
1000.0000 mg | Freq: Four times a day (QID) | INTRAVENOUS | Status: DC | PRN
  Filled 2017-08-15: qty 10

## 2017-08-15 MED ORDER — HYDROCORTISONE 1 % EX CREA: 1.0000 "application " | TOPICAL_CREAM | Freq: Three times a day (TID) | CUTANEOUS | Status: DC | PRN

## 2017-08-15 MED ORDER — SODIUM CHLORIDE 0.9% FLUSH
3.0000 mL | Freq: Two times a day (BID) | INTRAVENOUS | Status: DC
  Administered 2017-08-15 (×2): 3 mL via INTRAVENOUS

## 2017-08-15 MED ORDER — HYDROMORPHONE HCL 1 MG/ML IJ SOLN
0.5000 mg | INTRAMUSCULAR | Status: DC | PRN
Start: 2017-08-15 — End: 2017-08-16

## 2017-08-15 MED ORDER — METOCLOPRAMIDE HCL 5 MG/ML IJ SOLN: 5.0000 mg | Freq: Four times a day (QID) | INTRAMUSCULAR | Status: DC | PRN

## 2017-08-15 MED ORDER — HYDRALAZINE HCL 20 MG/ML IJ SOLN: 5.0000 mg | INTRAMUSCULAR | Status: DC | PRN

## 2017-08-15 MED ORDER — PHENOL 1.4 % MT LIQD: 1.0000 | OROMUCOSAL | Status: DC | PRN

## 2017-08-15 MED ORDER — TRAMADOL HCL 50 MG PO TABS: 50.0000 mg | ORAL_TABLET | Freq: Four times a day (QID) | ORAL | Status: DC | PRN

## 2017-08-15 MED ORDER — ACETAMINOPHEN 500 MG PO TABS: 1000.0000 mg | ORAL_TABLET | Freq: Three times a day (TID) | ORAL | Status: DC

## 2017-08-15 MED ORDER — ENSURE SURGERY PO LIQD
237.0000 mL | Freq: Two times a day (BID) | ORAL | Status: DC
  Administered 2017-08-15 (×2): 237 mL via ORAL
  Filled 2017-08-15 (×4): qty 237

## 2017-08-15 MED ORDER — MENTHOL 3 MG MT LOZG: 1.0000 | LOZENGE | OROMUCOSAL | Status: DC | PRN

## 2017-08-15 MED ORDER — SODIUM CHLORIDE 0.9% FLUSH: 3.0000 mL | INTRAVENOUS | Status: DC | PRN

## 2017-08-15 NOTE — Progress Notes (Signed)
Pt complained of pain when flushing IV; Site is red, edematous, tender. IV removed; but pt refuses new IV placement. States she will talk to MD in the am. Pt educated that she will not be able to receive any IV medications.  Pt verbalizes understanding and agrees to new IV placement if indicated.

## 2017-08-15 NOTE — Progress Notes (Signed)
Pharmacy Brief Note - Alvimopan (Entereg)  The standing order set for alvimopan (Entereg) now includes an automatic order to discontinue the drug after the patient has had a bowel movement. The change was approved by the [place] and the Medical Executive Committee.   This patient has had bowel movements documented by nursing. Therefore, alvimopan has been discontinued. If there are questions, please contact the pharmacy at [phone].   Thank you- Dolly Rias RPh 08/15/2017, 11:32 AM Pager [phone]

## 2017-08-15 NOTE — Progress Notes (Signed)
[address] [place], [place] [address] Phone: [phone]  FAX: [phone]      Tracy Blackwell [id number] 07/08/1934  CARE TEAM:  PCP: Maury Dus, MD  Outpatient Care Team: Patient Care Team: Maury Dus, MD as PCP - General (Family Medicine) Ileana Roup, MD as Consulting Physician (General Surgery) Ronnette Juniper, MD as Consulting Physician (Gastroenterology)  Inpatient Treatment Team: Treatment Team: Attending Provider: Ileana Roup, MD; Technician: Leda Quail, NT; Registered Nurse: Pennie Rushing, RN; Registered Nurse: Vicente Serene, RN; Consulting Physician: Nolon Nations, MD   Problem List:   Principal Problem:   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  Scheduled and as needed backup Follow-up on pathology -VTE prophylaxis- SCDs, etc -mobilize as tolerated to help recovery  20 minutes spent in review, evaluation, examination, counseling, and coordination of care.  More than 50% of that time was spent in counseling.  I discussed operative findings, updated the patient's status, discussed probable steps to recovery, and gave postoperative recommendations to the patient.  Recommendations were made.  Questions were answered.  She expressed understanding & appreciation.   Adin Hector, M.D., F.A.C.S. Gastrointestinal and Minimally Invasive Surgery [facility] [address] [place], [place] [address] [phone] Main /  Paging   08/15/2017    Subjective: (Chief complaint)  Tolerating liquids.  Has not gotten out of bed much.  Questions about surgery.  Feels like she is doing rather well so far.  Happily surprised.  Objective:  Vital signs:  Vitals:   08/14/17 1800 08/14/17 2137 08/15/17 0119 08/15/17 0613  BP: 128/70 (!) 144/67 128/69 120/65  Pulse: 68 63 66 68  Resp: 18 18 15 14   Temp: 98 F (36.7 C) 98.2 F (36.8 C) 98.1 F (36.7 C) 98 F (36.7 C)  TempSrc: Oral Oral Oral Oral  SpO2: 99% 98% 99% 99%  Weight:      Height:           Intake/Output   Yesterday:  12/31 0701 - 01/01 0700 In: 7591 [P.O.:300; I.V.:2928; IV Piggyback:50] Out: 2260 [Urine:2210; Blood:50] This shift:  No intake/output data recorded.  Bowel function:  Flatus: YES  BM:  No  Drain: (No drain)   Physical Exam:  General: Pt awake/alert/oriented x4 in no acute distress.  Smiling.  Cheerful. Eyes: PERRL, normal EOM.  Sclera clear.  No icterus Neuro: CN II-XII intact w/o focal sensory/motor deficits. Lymph: No head/neck/groin lymphadenopathy Psych:  No delerium/psychosis/paranoia HENT: Normocephalic, Mucus membranes moist.  No thrush Neck: Supple, No tracheal deviation Chest: No chest wall pain w good excursion CV:  Pulses intact.  Regular rhythm MS: Normal AROM mjr joints.  No obvious deformity  Abdomen: Soft.  Nondistended.  Mildly tender at incisions only.  No evidence of peritonitis.  No incarcerated hernias.  Ext:  No deformity.  No mjr edema.  No cyanosis Skin: No petechiae / purpura  Results:   Labs: Results for orders placed or performed during the hospital encounter of 08/14/17 (from the past 48 hour(s))  CEA     Status: None   Collection Time: 08/14/17  11:28 AM  Result Value Ref Range   CEA 3.8 0.0 - 4.7 ng/mL    Comment: (NOTE)       Roche ECLIA methodology       Nonsmokers  <3.9                                     Smokers     <5.6 Performed At: [facility] [place], [place] [id number] Rush Farmer MD OM:[id number]   CBC     Status: Abnormal   Collection Time: 08/14/17  1:50 PM  Result Value Ref Range   WBC 10.7 (H) 4.0 - 10.5 K/uL   RBC 4.37 3.87 - 5.11 MIL/uL   Hemoglobin 14.3 12.0 - 15.0 g/dL   HCT 41.4 36.0 - 46.0 %   MCV 94.7 78.0 - 100.0 fL   MCH 32.7 26.0 - 34.0 pg   MCHC 34.5 30.0 - 36.0 g/dL   RDW 13.9 11.5 - 15.5 %   Platelets 187 150 - 400 K/uL  Creatinine, serum     Status: None   Collection Time: 08/14/17  1:50 PM  Result Value Ref Range   Creatinine, Ser 0.80 0.44 - 1.00 mg/dL   GFR calc non Af Amer >60 >60 mL/min   GFR calc Af Amer >60 >60 mL/min    Comment: (NOTE) The eGFR has been calculated using the CKD EPI equation. This calculation has not been validated in all clinical situations. eGFR's persistently <60 mL/min signify possible Chronic Kidney Disease.   CBC     Status: Abnormal   Collection Time: 08/15/17  4:21 AM  Result Value Ref Range   WBC 11.6 (H) 4.0 - 10.5 K/uL   RBC 3.92 3.87 - 5.11 MIL/uL   Hemoglobin 12.5 12.0 - 15.0 g/dL   HCT 37.0 36.0 - 46.0 %   MCV 94.4 78.0 - 100.0 fL   MCH 31.9 26.0 - 34.0 pg   MCHC 33.8 30.0 - 36.0 g/dL   RDW 14.1 11.5 - 15.5 %   Platelets 214 150 - 400 K/uL  Basic metabolic panel     Status: Abnormal   Collection Time: 08/15/17  4:21 AM  Result Value Ref Range   Sodium 137 135 - 145 mmol/L   Potassium 3.5 3.5 - 5.1 mmol/L   Chloride 104 101 - 111 mmol/L   CO2 26 22 - 32 mmol/L   Glucose, Bld 112 (H) 65 - 99 mg/dL   BUN 10 6 - 20 mg/dL   Creatinine, Ser 0.79 0.44 - 1.00 mg/dL   Calcium 9.0 8.9 - 10.3 mg/dL   GFR calc non Af Amer >60 >60 mL/min   GFR calc Af Amer >60 >60 mL/min    Comment: (NOTE) The eGFR has been calculated using the CKD EPI equation. This calculation has not been validated in all clinical situations. eGFR's persistently <60 mL/min signify possible Chronic Kidney Disease.    Anion gap 7 5 - 15  Magnesium     Status: Abnormal    Collection Time: 08/15/17  4:21 AM  Result Value Ref Range   Magnesium 1.6 (L) 1.7 - 2.4 mg/dL  Phosphorus     Status: None   Collection Time: 08/15/17  4:21 AM  Result Value Ref Range   Phosphorus 3.6 2.5 - 4.6 mg/dL    Imaging / Studies: Dg C-arm 1-60 Min-no Report  Result Date: 08/14/2017 Fluoroscopy was utilized  by the requesting physician.  No radiographic interpretation.    Medications / Allergies: per chart  Antibiotics: Anti-infectives (From admission, onward)   Start     Dose/Rate Route Frequency Ordered Stop   08/14/17 0648  cefoTEtan in Dextrose 5% (CEFOTAN) 2-2.08 GM-%(50ML) IVPB    Comments:  Key, Kristopher   : cabinet override      08/14/17 0648 08/14/17 0752   08/14/17 0538  cefoTEtan in Dextrose 5% (CEFOTAN) IVPB 2 g     2 g Intravenous On call to O.R. 08/14/17 6384 08/14/17 5364        Note: Portions of this report may have been transcribed using voice recognition software. Every effort was made to ensure accuracy; however, inadvertent computerized transcription errors may be present.   Any transcriptional errors that result from this process are unintentional.     Adin Hector, M.D., F.A.C.S. Gastrointestinal and Minimally Invasive Surgery [facility] [address] [place], [place] [address] [phone] Main / Paging   08/15/2017

## 2017-08-16 LAB — CBC WITH DIFFERENTIAL/PLATELET
Basophils Absolute: 0 10*3/uL (ref 0.0–0.1)
Basophils Relative: 1 %
EOS ABS: 0.1 10*3/uL (ref 0.0–0.7)
Eosinophils Relative: 2 %
HEMATOCRIT: 38.2 % (ref 36.0–46.0)
HEMOGLOBIN: 13 g/dL (ref 12.0–15.0)
LYMPHS ABS: 1.6 10*3/uL (ref 0.7–4.0)
Lymphocytes Relative: 25 %
MCH: 32.5 pg (ref 26.0–34.0)
MCHC: 34 g/dL (ref 30.0–36.0)
MCV: 95.5 fL (ref 78.0–100.0)
Monocytes Absolute: 0.5 10*3/uL (ref 0.1–1.0)
Monocytes Relative: 8 %
NEUTROS ABS: 4.3 10*3/uL (ref 1.7–7.7)
NEUTROS PCT: 64 %
Platelets: 210 10*3/uL (ref 150–400)
RBC: 4 MIL/uL (ref 3.87–5.11)
RDW: 14.5 % (ref 11.5–15.5)
WBC: 6.5 10*3/uL (ref 4.0–10.5)

## 2017-08-16 LAB — BASIC METABOLIC PANEL
ANION GAP: 6 (ref 5–15)
BUN: 10 mg/dL (ref 6–20)
CHLORIDE: 107 mmol/L (ref 101–111)
CO2: 30 mmol/L (ref 22–32)
Calcium: 9.2 mg/dL (ref 8.9–10.3)
Creatinine, Ser: 0.67 mg/dL (ref 0.44–1.00)
GFR calc Af Amer: >60 mL/min (ref >60)
GFR calc non Af Amer: >60 mL/min (ref >60)
Glucose, Bld: 86 mg/dL (ref 65–99)
POTASSIUM: 3.4 mmol/L — AB (ref 3.5–5.1)
SODIUM: 143 mmol/L (ref 135–145)

## 2017-08-16 LAB — PHOSPHORUS: Phosphorus: 3 mg/dL (ref 2.5–4.6)

## 2017-08-16 LAB — MAGNESIUM: MAGNESIUM: 1.8 mg/dL (ref 1.7–2.4)

## 2017-08-16 MED ORDER — TRAMADOL HCL 50 MG PO TABS: 50.0000 mg | ORAL_TABLET | Freq: Four times a day (QID) | ORAL | Status: DC | PRN

## 2017-08-16 MED ORDER — TRAMADOL HCL 50 MG PO TABS: 50.0000 mg | ORAL_TABLET | Freq: Four times a day (QID) | ORAL | 0 refills | Status: DC | PRN

## 2017-08-16 NOTE — Discharge Instructions (Signed)
POST OP INSTRUCTIONS ° °1. DIET: As tolerated. Follow a light bland diet the first 24 hours after arrival home, such as soup, liquids, crackers, etc.  Be sure to include lots of fluids daily.  Avoid fast food or heavy meals as your are more likely to get nauseated.  Eat a low fat the next few days after surgery. ° °2. Take your usually prescribed home medications unless otherwise directed. ° °3. PAIN CONTROL: °a. Pain is best controlled by a usual combination of three different methods TOGETHER: °i. Ice/Heat °ii. Over the counter pain medication °iii. Prescription pain medication °b. Most patients will experience some swelling and bruising around the surgical site.  Ice packs or heating pads (30-60 minutes up to 6 times a day) will help. Some people prefer to use ice alone, heat alone, alternating between ice & heat.  Experiment to what works for you.  Swelling and bruising can take several weeks to resolve.   °c. It is helpful to take an over-the-counter pain medication regularly for the first few weeks: °i. Ibuprofen (Motrin/Advil) - 200mg tabs - take 3 tabs (600mg) every 6 hours as needed for pain °ii. Acetaminophen (Tylenol) - you may take 650mg every 6 hours as needed. You can take this with motrin as they act differently on the body. If you are taking a narcotic pain medication that has acetaminophen in it, do not take over the counter tylenol at the same time. ° Iii. NOTE: You may take both of these medications together - most patients  find it most helpful when alternating between the two (i.e. Ibuprofen at 6am, tylenol at 9am, ibuprofen at 12pm ...) °d. A  prescription for pain medication should be given to you upon discharge.  Take your pain medication as prescribed if your pain is not adequatly controlled with the over-the-counter pain reliefs mentioned above. ° °4. Avoid getting constipated.  Between the surgery and the pain medications, it is common to experience some constipation.  Increasing fluid  intake and taking a fiber supplement (such as Metamucil, Citrucel, FiberCon, MiraLax, etc) 1-2 times a day regularly will usually help prevent this problem from occurring.  A mild laxative (prune juice, Milk of Magnesia, MiraLax, etc) should be taken according to package directions if there are no bowel movements after 48 hours.   ° °5. Dressing: Your incision is covered in Dermabond which is like sterile superglue for the skin. This will come off on it's own in a couple weeks. It is waterproof and you may bathe normally starting the day after your surgery in a shower. Avoid baths/pools/lakes/oceans until your wounds have fully healed. ° °6. ACTIVITIES as tolerated:   °a. Avoid heavy lifting (>10lbs or 1 gallon of milk) for the next 6 weeks. °b. You may resume regular (light) daily activities beginning the next day--such as daily self-care, walking, climbing stairs--gradually increasing activities as tolerated.  If you can walk 30 minutes without difficulty, it is safe to try more intense activity such as jogging, treadmill, bicycling, low-impact aerobics.  °c. DO NOT PUSH THROUGH PAIN.  Let pain be your guide: If it hurts to do something, don't do it. °d. You may drive when you are no longer taking prescription pain medication, you can comfortably wear a seatbelt, and you can safely maneuver your car and apply brakes. ° ° °7. FOLLOW UP in our office °a. Please call [facility] at [phone] to set up an appointment to see your surgeon in the office for a follow-up appointment   approximately 2 weeks after your surgery. °b. Make sure that you call for this appointment the day you arrive home to insure a convenient appointment time. ° °9. If you have disability or family leave forms that need to be completed, you may have them completed by your primary care physician's office; for return to work instructions, please ask our office staff and they will be happy to assist you in obtaining this documentation °  °When to call  us [phone]: °1. Poor pain control °2. Reactions / problems with new medications (rash/itching, etc)  °3. Fever over 101.5 F (38.5 C) °4. Inability to urinate °5. Nausea/vomiting °6. Worsening swelling or bruising °7. Continued bleeding from incision. °8. Increased pain, redness, or drainage from the incision ° °The clinic staff is available to answer your questions during regular business hours (8:30am-5pm).  Please don’t hesitate to call and ask to speak to one of our nurses for clinical concerns.   A surgeon from [facility] is always on call at the hospitals °  °If you have a medical emergency, go to the nearest emergency room or call 911. ° °[facility] °[address], [address], [place], [place]  [address] °MAIN: [phone] °FAX: [phone] °[email or web address] °

## 2017-08-16 NOTE — Progress Notes (Signed)
Discharge and medication instructions reviewed with patient. Questions answered and patient denies further questions. Patient has someone coming to pick her up at 1200 to drive her home. One prescription given to patient. Donne Hazel, RN

## 2017-08-16 NOTE — Progress Notes (Signed)
Subjective No acute events. Feeling great. Ate two full meals yesterday without n/v. Ambulating multiple times per day. Foley out this morning, has yet to void. Multiple bowel movements - semi formed stool  Objective: Vital signs in last 24 hours: Temp:  [97.6 F (36.4 C)-98.1 F (36.7 C)] 97.8 F (36.6 C) (01/02 0619) Pulse Rate:  [66-77] 66 (01/02 0619) Resp:  [16-18] 18 (01/02 0619) BP: (134-158)/(61-78) 158/75 (01/02 0619) SpO2:  [94 %-100 %] 94 % (01/02 0619) Last BM Date: 08/15/17  Intake/Output from previous day: 01/01 0701 - 01/02 0700 In: 1410 [P.O.:1410] Out: 3150 [Urine:3150] Intake/Output this shift: No intake/output data recorded.  Gen: NAD, comfortable CV: RRR Pulm: Normal work of breathing Abd: Soft, NT/ND; incisions c/d/i without erythema Ext: SCDs in place  Lab Results: CBC  Recent Labs    08/15/17 0421 08/16/17 0616  WBC 11.6* 6.5  HGB 12.5 13.0  HCT 37.0 38.2  PLT 214 210   BMET Recent Labs    08/14/17 1350 08/15/17 0421  NA  --  137  K  --  3.5  CL  --  104  CO2  --  26  GLUCOSE  --  112*  BUN  --  10  CREATININE 0.80 0.79  CALCIUM  --  9.0   PT/INR No results for input(s): LABPROT, INR in the last 72 hours. ABG No results for input(s): PHART, HCO3 in the last 72 hours.  Invalid input(s): PCO2, PO2  Studies/Results:  Anti-infectives: Anti-infectives (From admission, onward)   Start     Dose/Rate Route Frequency Ordered Stop   08/14/17 0648  cefoTEtan in Dextrose 5% (CEFOTAN) 2-2.08 GM-%(50ML) IVPB    Comments:  Key, Kristopher   : cabinet override      08/14/17 0648 08/14/17 0752   08/14/17 0538  cefoTEtan in Dextrose 5% (CEFOTAN) IVPB 2 g     2 g Intravenous On call to O.R. 08/14/17 0538 08/14/17 3300       Assessment/Plan: Patient Active Problem List   Diagnosis Date Noted  . Umbilical hernia s/p primary repair 08/14/2017 08/15/2017  . Hypertension   . 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 Dema Severin, M.D. General and Colorectal Surgery [facility]

## 2017-08-16 NOTE — Discharge Summary (Addendum)
  Patient ID: Tracy Blackwell MRN: [id number] DOB/AGE: 82-02-1934 84 y.o.  Admit date: 08/14/2017 Discharge date: 08/16/2017  Discharge Diagnoses Patient Active Problem List   Diagnosis Date Noted  . Umbilical hernia s/p primary repair 08/14/2017 08/15/2017  . Hypertension   . 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 Recovered well. On POD#2, she was tolerating a regular diet, pain was controlled for oral analgesics, having bowel movements.     Allergies as of 08/16/2017      Reactions   Wasp Venom Anaphylaxis   Codeine Nausea And Vomiting      Medication List    TAKE these medications   amLODipine 5 MG tablet Commonly known as:  NORVASC Take 5 mg by mouth daily.   LORazepam 0.5 MG tablet Commonly known as:  ATIVAN TAKE HALF TO ONE TABLET BY MOUTH 2 TIMES DAILY AS NEEDED FOR ANXIETY   losartan-hydrochlorothiazide 100-12.5 MG tablet Commonly known as:  HYZAAR Take 1 tablet by mouth daily.   metoprolol succinate 100 MG 24 hr tablet Commonly known as:  TOPROL-XL Take 100 mg by mouth daily.   multivitamin with minerals Tabs tablet Take 1 tablet by mouth daily.   naproxen sodium 220 MG tablet Commonly known as:  ALEVE Take 220 mg by mouth daily as needed (mild pain).   traMADol 50 MG tablet Commonly known as:  ULTRAM Take 1-2 tablets (50-100 mg total) by mouth every 6 (six) hours as needed for severe pain.        Follow-up Information    Ileana Roup, MD Follow up in 2 week(s).   Specialty:  General Surgery Contact information: [place] [address] [phone]           Mickey Hebel M. Dema Severin, M.D. [facility]

## 2017-09-01 ENCOUNTER — Encounter: Payer: Self-pay | Admitting: Oncology

## 2017-09-01 ENCOUNTER — Inpatient Hospital Stay: Payer: Medicare Other | Attending: Oncology | Admitting: Oncology

## 2017-09-01 ENCOUNTER — Telehealth: Payer: Self-pay | Admitting: Oncology

## 2017-09-01 VITALS — BP 159/76 | HR 68 | Temp 97.6°F | Resp 18 | Ht 62.0 in | Wt 150.1 lb

## 2017-09-01 DIAGNOSIS — I10 Essential (primary) hypertension: Secondary | ICD-10-CM | POA: Insufficient documentation

## 2017-09-01 DIAGNOSIS — C19 Malignant neoplasm of rectosigmoid junction: Secondary | ICD-10-CM | POA: Diagnosis not present

## 2017-09-01 NOTE — Progress Notes (Signed)
  [name] OFFICE PROGRESS NOTE   Diagnosis: Colon cancer  INTERVAL HISTORY:   Tracy Blackwell returns as scheduled.  She underwent a laparoscopic anterior resection by Dr. Dema Severin on 08/14/2017.   There was no evidence of metastatic disease.  The anastomosis is at 15 cm from the anal verge. The pathology ([id number]) revealed invasive adenocarcinoma of the distal sigmoid colon.  Tumor extended into pericolonic adipose tissue.  22 lymph nodes were negative for metastatic carcinoma.  The resection margins were negative.  No macroscopic tumor perforation.  No lymphovascular or perineural invasion.  Tumor returned MSI-stable with no loss of mismatch repair protein expression.  She reports an uneventful operative recovery. Objective:  Vital signs in last 24 hours:  Blood pressure (!) 159/76, pulse 68, temperature 97.6 F (36.4 C), temperature source Oral, resp. rate 18, height '5\' 2"'$  (1.575 m), weight 150 lb 1.6 oz (68.1 kg), SpO2 99 %.    Resp: Lungs clear bilaterally Cardio: Regular rate and rhythm GI: No hepatomegaly, healed surgical incisions Vascular: No leg edema    Lab Results:  Lab Results  Component Value Date   WBC 6.5 08/16/2017   HGB 13.0 08/16/2017   HCT 38.2 08/16/2017   MCV 95.5 08/16/2017   PLT 210 08/16/2017   NEUTROABS 4.3 08/16/2017    Lab Results  Component Value Date   CEA1 3.8 08/14/2017    Lab Results  Component Value Date   INR 0.9 06/22/2007    Imaging:  No results found.  Medications: I have reviewed the patient's current medications.   Assessment/Plan: 1. Adenocarcinoma of the colon ? Partially obstructing mass at 25 cm on colonoscopy 07/17/2017 with a biopsy confirming moderately differentiated adenocarcinoma, incomplete colonoscopy ? CT abdomen/pelvis 07/05/2017-rectosigmoid mass with evidence of transmural spread and regional adenopathy, no evidence of distant metastatic disease ? Laparoscopic anterior resection  08/14/2017, stage II (T3N0) moderately differentiated adenocarcinoma, 0/22 lymph nodes positive for metastatic carcinoma, MSI-stable, no loss of mismatch repair protein expression  2. Hypertension   Disposition: Tracy Blackwell underwent resection of the distal sigmoid colon tumor.  She has experienced an uneventful operative recovery.  I reviewed the details of the surgical pathology report with Tracy Blackwell and her daughter.  She has been diagnosed with stage II colon cancer.  The clinical presentation and pathology do not suggest high risk features.  She has a good prognosis for a long-term disease-free survival.  I do not recommend adjuvant systemic therapy.  She will return for an office visit and CEA in 6 months.  She will schedule a completion colonoscopy with gastroenterology.  She does not appear to have hereditary non-polyposis colon cancer syndrome.  However her family members are at increased risk of developing colorectal cancer and should receive appropriate screening.  25 minutes were spent with the patient today.  The majority of the time was used for counseling and coordination of care.    Betsy Coder, MD  09/01/2017  11:07 AM

## 2017-09-01 NOTE — Telephone Encounter (Signed)
Gave patient avs and calendar with appts per 11/8 los.  °

## 2018-01-17 ENCOUNTER — Telehealth: Payer: Self-pay | Admitting: Oncology

## 2018-01-17 NOTE — Telephone Encounter (Signed)
Spoke w/ patient regarding change in lab/GS appt from 7/18 to 7/23 @ 11:45/12:30.  Mailed reminder calendar.

## 2018-03-01 ENCOUNTER — Ambulatory Visit: Payer: Medicare Other | Admitting: Oncology

## 2018-03-01 ENCOUNTER — Other Ambulatory Visit: Payer: Medicare Other

## 2018-03-06 ENCOUNTER — Inpatient Hospital Stay (HOSPITAL_BASED_OUTPATIENT_CLINIC_OR_DEPARTMENT_OTHER): Payer: Medicare Other | Admitting: Oncology

## 2018-03-06 ENCOUNTER — Telehealth: Payer: Self-pay | Admitting: Oncology

## 2018-03-06 ENCOUNTER — Inpatient Hospital Stay: Payer: Medicare Other | Attending: Oncology

## 2018-03-06 VITALS — BP 151/75 | HR 66 | Temp 97.5°F | Resp 18 | Ht 62.0 in | Wt 156.2 lb

## 2018-03-06 DIAGNOSIS — I10 Essential (primary) hypertension: Secondary | ICD-10-CM | POA: Diagnosis not present

## 2018-03-06 DIAGNOSIS — C19 Malignant neoplasm of rectosigmoid junction: Secondary | ICD-10-CM

## 2018-03-06 LAB — CEA (IN HOUSE-CHCC): CEA (CHCC-In House): 2.25 ng/mL (ref 0.00–5.00)

## 2018-03-06 NOTE — Progress Notes (Signed)
  [facility] OFFICE PROGRESS NOTE   Diagnosis: Colon cancer  INTERVAL HISTORY:   Tracy Blackwell returns as scheduled.  She feels well.  No difficulty with bowel function.  No complaint.  Objective:  Vital signs in last 24 hours:  Blood pressure (!) 151/75, pulse 66, temperature (!) 97.5 F (36.4 C), temperature source Oral, resp. rate 18, height _0  (1.575 m), weight 156 lb 3.2 oz (70.9 kg), SpO2 100 %.    HEENT: Neck without mass Lymphatics: No cervical, supraclavicular, axillary, or inguinal nodes. Resp: Lungs clear bilaterally Cardio: Regular rate and rhythm GI: No hepatosplenomegaly, no mass, nontender Vascular: No leg edema  Lab Results:   Lab Results  Component Value Date   CEA1 2.25 03/06/2018     Medications: I have reviewed the patient's current medications.   Assessment/Plan: 1. Adenocarcinoma of the colon ? Partially obstructing mass at 25 cm on colonoscopy 07/17/2017 with a biopsy confirming moderately differentiated adenocarcinoma,incomplete colonoscopy ? CT abdomen/pelvis 07/05/2017-rectosigmoid mass with evidence of transmural spread and regional adenopathy, no evidence of distant metastatic disease ? Laparoscopic anterior resection 08/14/2017, stage II (T3N0) moderately differentiated adenocarcinoma, 0/22 lymph nodes positive for metastatic carcinoma, MSI-stable, no loss of mismatch repair protein expression  2. Hypertension  Disposition: Tracy Blackwell is in clinical remission from colon cancer.  We will recommend she contact Dr. Therisa Doyne for a completion colonoscopy.  She will return for an office visit and CEA in 6 months.  15 minutes were spent with the patient today.  The majority of the time was used for counseling and coordination of care.  Betsy Coder, MD  03/06/2018  12:56 PM

## 2018-03-06 NOTE — Telephone Encounter (Signed)
Gave patient avs and calendar of upcoming jan appts. °

## 2018-03-07 ENCOUNTER — Telehealth: Payer: Self-pay | Admitting: Emergency Medicine

## 2018-03-07 NOTE — Telephone Encounter (Addendum)
Pt verbalized understanding   ----- Message from Ladell Pier, MD sent at 03/06/2018  5:06 PM EDT ----- Please call patient, CEA is normal, follow-up as scheduled

## 2018-09-03 ENCOUNTER — Telehealth: Payer: Self-pay | Admitting: Oncology

## 2018-09-03 NOTE — Telephone Encounter (Signed)
Called patient per 01/16 voicemail log. No response.

## 2018-09-04 ENCOUNTER — Inpatient Hospital Stay: Payer: Medicare Other | Attending: Oncology

## 2018-09-04 ENCOUNTER — Inpatient Hospital Stay: Payer: Medicare Other | Admitting: Oncology

## 2018-09-10 ENCOUNTER — Telehealth: Payer: Self-pay | Admitting: Oncology

## 2018-09-10 NOTE — Telephone Encounter (Signed)
R/s appt per 1/24 sch message - pt is aware of appt date and time  

## 2018-10-11 ENCOUNTER — Inpatient Hospital Stay: Payer: Medicare Other | Attending: Oncology

## 2018-10-11 ENCOUNTER — Telehealth: Payer: Self-pay | Admitting: *Deleted

## 2018-10-11 ENCOUNTER — Inpatient Hospital Stay: Payer: Medicare Other | Admitting: Oncology

## 2018-10-11 ENCOUNTER — Telehealth: Payer: Self-pay | Admitting: Oncology

## 2018-10-11 ENCOUNTER — Other Ambulatory Visit: Payer: Self-pay

## 2018-10-11 VITALS — BP 161/96 | HR 68 | Temp 97.6°F | Resp 19 | Wt 165.1 lb

## 2018-10-11 DIAGNOSIS — C187 Malignant neoplasm of sigmoid colon: Secondary | ICD-10-CM

## 2018-10-11 DIAGNOSIS — C19 Malignant neoplasm of rectosigmoid junction: Secondary | ICD-10-CM | POA: Diagnosis present

## 2018-10-11 DIAGNOSIS — R32 Unspecified urinary incontinence: Secondary | ICD-10-CM | POA: Diagnosis not present

## 2018-10-11 DIAGNOSIS — I10 Essential (primary) hypertension: Secondary | ICD-10-CM | POA: Diagnosis not present

## 2018-10-11 LAB — CEA (IN HOUSE-CHCC): CEA (CHCC-In House): 3.06 ng/mL (ref 0.00–5.00)

## 2018-10-11 NOTE — Telephone Encounter (Signed)
-----   Message from Ladell Pier, MD sent at 10/11/2018  1:52 PM EST ----- Please call patient, the CEA is normal, follow-up as scheduled

## 2018-10-11 NOTE — Telephone Encounter (Signed)
TCT patient regarding her CEA level from today, 10/10/18. Spoke with patient and informed her that her CEA is normal. She is to follow up with Dr. Benay Spice as scheduled. Pt voiced understanding.  No further questions or concerns

## 2018-10-11 NOTE — Telephone Encounter (Signed)
Gave avs and calendar ° °

## 2018-10-11 NOTE — Progress Notes (Signed)
  [facility] OFFICE PROGRESS NOTE   Diagnosis: Colon cancer  INTERVAL HISTORY:   Tracy Blackwell returns as scheduled.  She feels well.  Good appetite.  She reports chronic urinary incontinence.  She is trying an over-the-counter medication.  Objective:  Vital signs in last 24 hours:  Blood pressure (!) 161/96, pulse 68, temperature 97.6 F (36.4 C), temperature source Oral, resp. rate 19, weight 165 lb 1.6 oz (74.9 kg), SpO2 99 %.    HEENT: Neck without mass Lymphatics: No cervical, supraclavicular, axillary, or inguinal nodes Resp: Lungs clear bilaterally Cardio: Regular rate and rhythm GI: No hepatosplenomegaly, nontender, no mass Vascular: No leg edema   Lab Results:   Lab Results  Component Value Date   CEA1 2.25 03/06/2018     Medications: I have reviewed the patient's current medications.   Assessment/Plan: 1. Adenocarcinoma of the colon ? Partially obstructing mass at 25 cm on colonoscopy 07/17/2017 with a biopsy confirming moderately differentiated adenocarcinoma,incomplete colonoscopy ? CT abdomen/pelvis 07/05/2017-rectosigmoid mass with evidence of transmural spread and regional adenopathy, no evidence of distant metastatic disease ? Laparoscopic anterior resection 08/14/2017, stage II (T3N0) moderately differentiated adenocarcinoma, 0/22 lymph nodes positive for metastatic carcinoma, MSI-stable, no loss of mismatch repair protein expression  2. Hypertension    Disposition: Tracy Blackwell is in remission from colon cancer.  We will follow-up on the CEA from today.  She would like to space out the next follow-up visit to 9 months.  She plans to meet with Dr. Therisa Doyne to discuss the indication for a surveillance colonoscopy.  15 minutes were spent with the patient today.  The majority of the time was used for counseling and coordination of care.  Betsy Coder, MD  10/11/2018  11:11 AM

## 2019-04-16 IMAGING — CT CT ABD-PELV W/ CM
1 of 4 series · 13 of 32 positions shown, 18 images · IV contrast (iopamidol)
Comparison: None.

CLINICAL DATA: Lower abdominal pain and tenderness.  Nausea.

EXAM:
CT ABDOMEN AND PELVIS WITH CONTRAST
TECHNIQUE: Multidetector CT imaging of the abdomen and pelvis was performed
using the standard protocol following bolus administration of
intravenous contrast.
CONTRAST:  100mL G31HQG-[id number] IOPAMIDOL (G31HQG-[id number]) INJECTION 61%
Creatinine was obtained on site at [HOSPITAL] at [HOSPITAL].
Results: Creatinine 0.9 mg/dL.

[Series 2: abd/pelvis w/cm · axial · 0.77mm/px · z∈[-469,-59]mm · 13 of 92 slices shown, 18 images]
[im 5/92  soft-tissue]
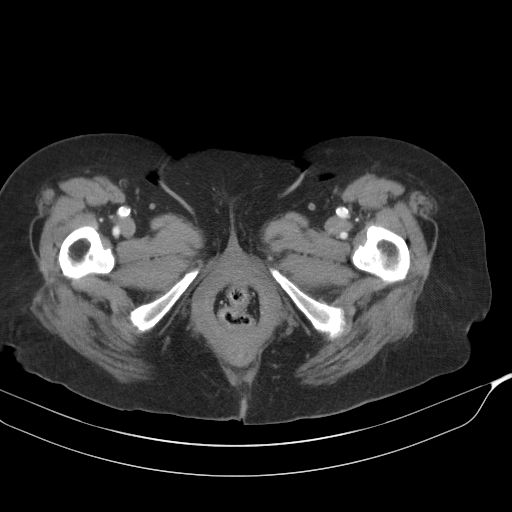
[im 5/92  bone]
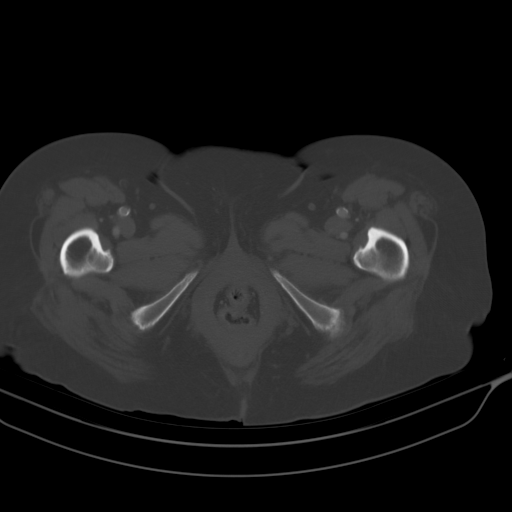
[im 15/92  soft-tissue]
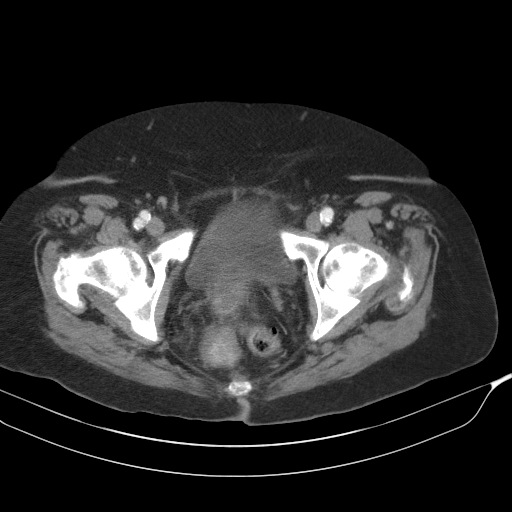
[im 20/92  soft-tissue]
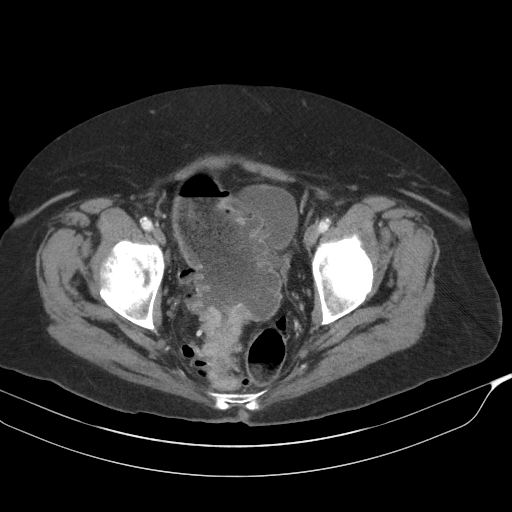
[im 29/92  soft-tissue]
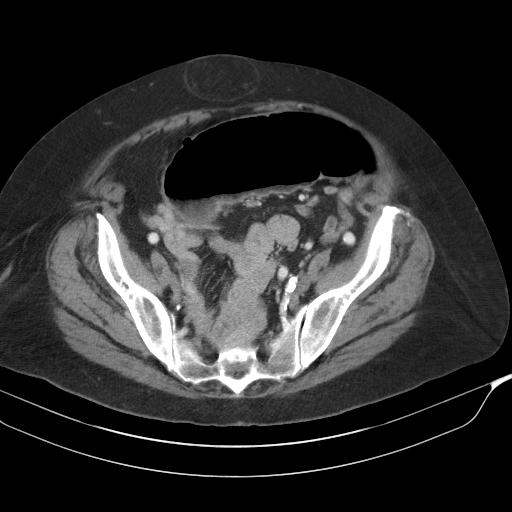
[im 34/92  soft-tissue]
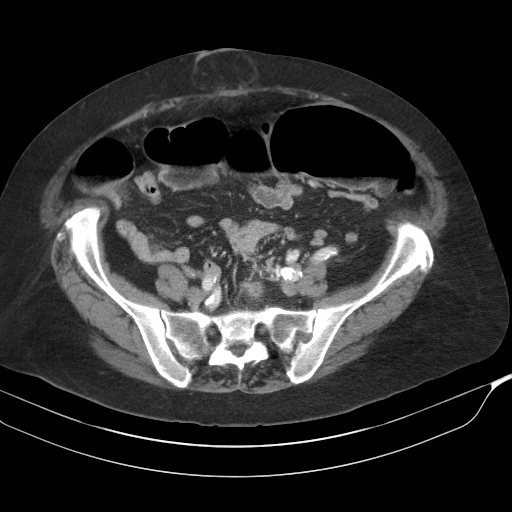
[im 44/92  soft-tissue]
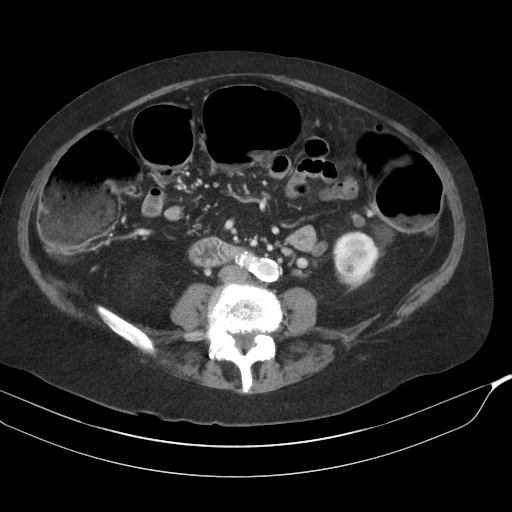
[im 48/92  soft-tissue]
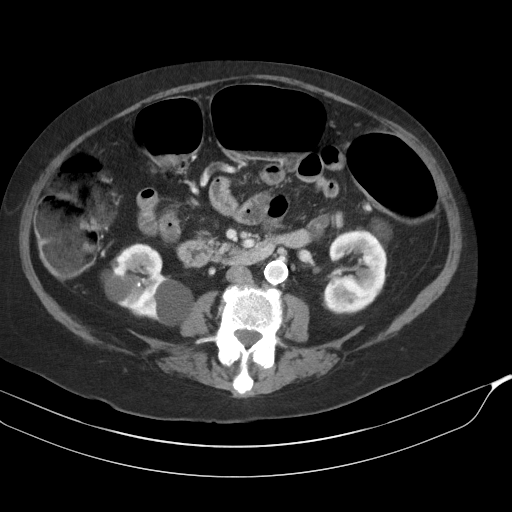
[im 58/92  soft-tissue]
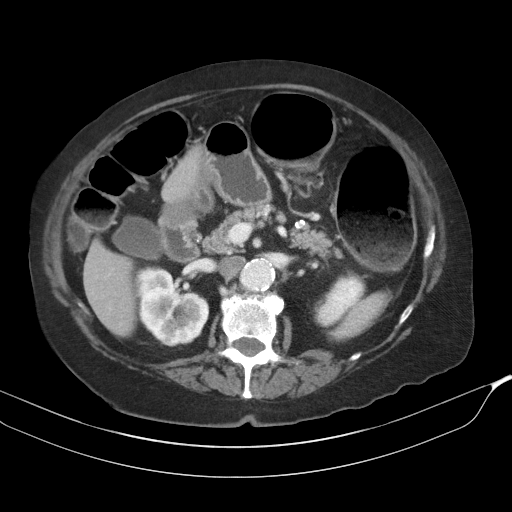
[im 63/92  soft-tissue]
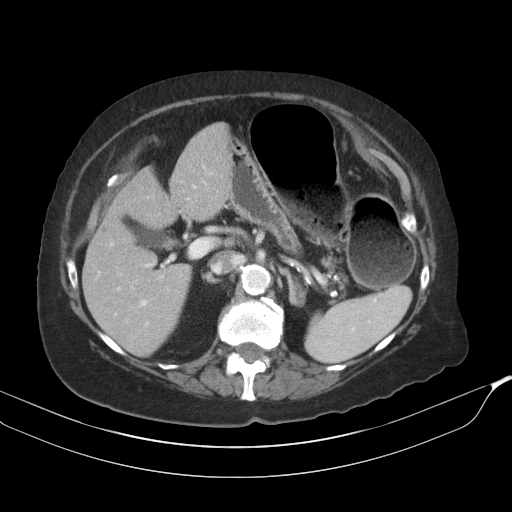
[im 63/92  bone]
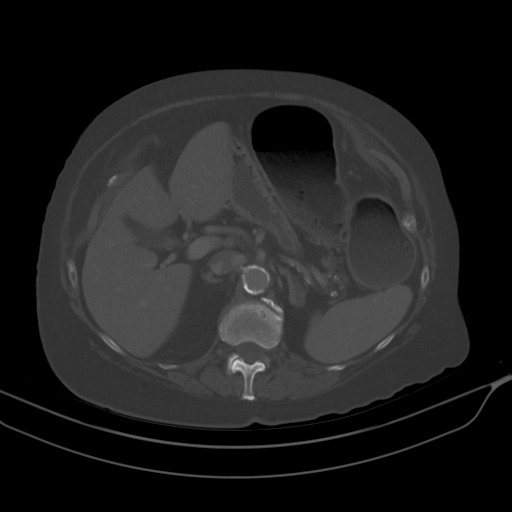
[im 72/92  soft-tissue]
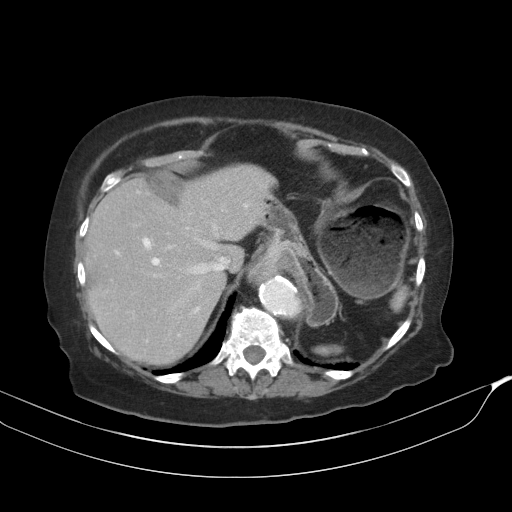
[im 72/92  lung]
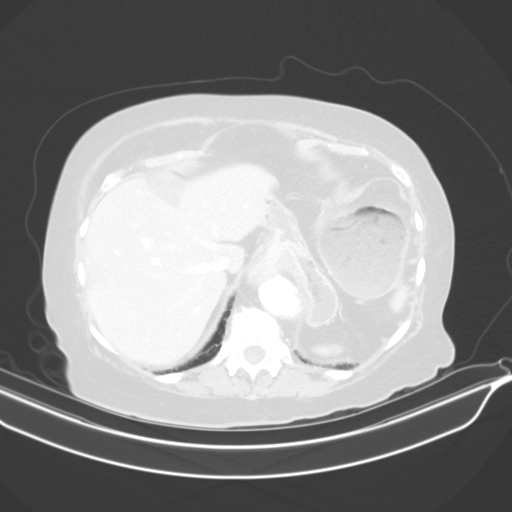
[im 77/92  soft-tissue]
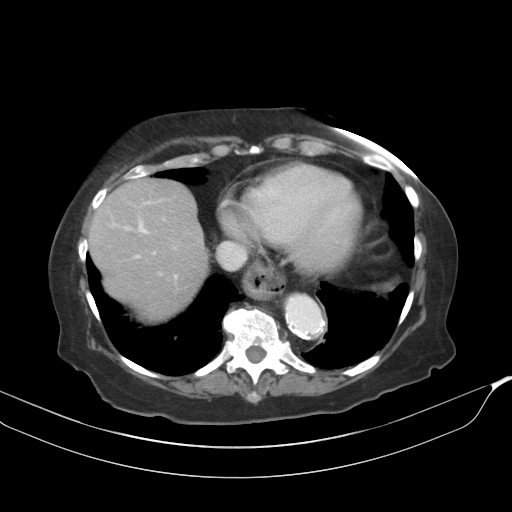
[im 77/92  lung]
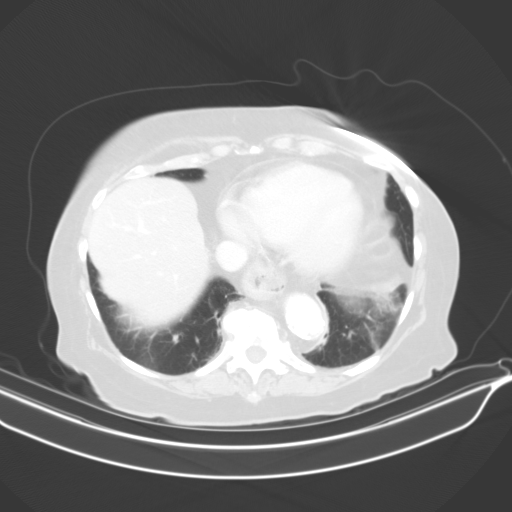
[im 82/92  lung]
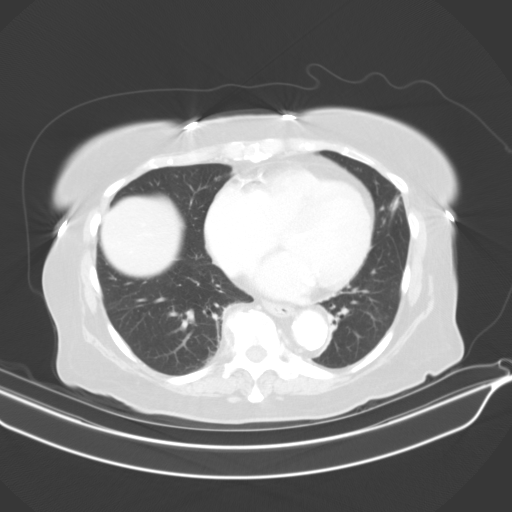
[im 87/92  soft-tissue]
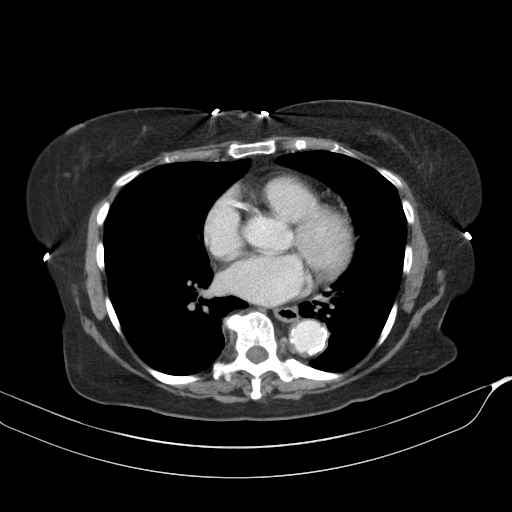
[im 87/92  lung]
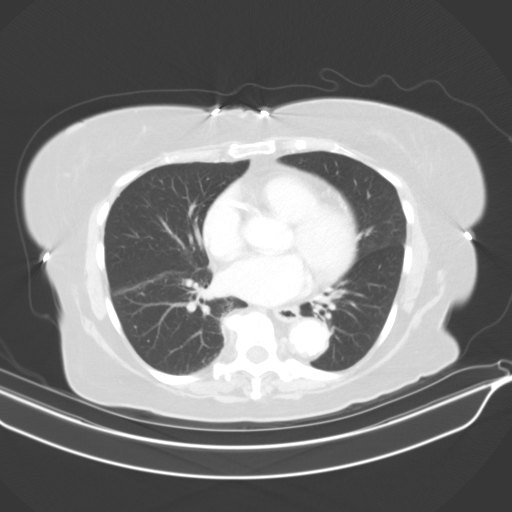

[13 of 32 positions shown; findings below may reference images not displayed]

FINDINGS: Lower chest: Bibasilar atelectasis. Mild cardiomegaly, without
pericardial or pleural effusion. Multivessel coronary artery
atherosclerosis. Lower thoracic aortic atherosclerosis. Small hiatal
hernia.

Hepatobiliary: Normal liver. Normal gallbladder, without biliary
ductal dilatation.

Pancreas: Normal, without mass or ductal dilatation.

Spleen: Insert splay

Adrenals/Urinary Tract: Normal adrenal glands. Bilateral renal cysts
and too small to characterize lesions. Punctate bilateral renal
collecting system calculi, without hydronephrosis. Normal urinary
bladder.

Stomach/Bowel: Normal remainder of the stomach.

Pelvic floor laxity with rectal prolapse, including image 4/series
3. A partially obstruct annular lesion at the rectosigmoid junction,
including on image 76/series 2 and sagittal image 62. This continues
for approximately 5.4 cm length. Mild perirectal interstitial
thickening, suggesting transmural spread. Example at the 3 o'clock
position on image 76/ series 2.

The more proximal colon is mildly dilated and fluid-filled.
Scattered colonic diverticula. Normal terminal ileum and appendix.

Normal small bowel caliber.

Vascular/Lymphatic: Aortic and branch vessel atherosclerosis. No
retroperitoneal or retrocrural adenopathy. Small nodes within the
mesorectum, including at 6 mm on image 69/series 2 and coronal image
79/series 5. Suspicious. No pelvic sidewall adenopathy.

Reproductive: Hysterectomy.  No adnexal mass.

Other: No significant free fluid. No free intraperitoneal air. Fat
containing ventral abdominal wall hernia. No evidence of omental or
peritoneal disease.

Musculoskeletal: Lumbosacral spondylosis.
IMPRESSION: 1. Findings most consistent with partially obstructive rectosigmoid
junction carcinoma. There is likely transmural spread with small but
suspicious regional lymph nodes.
2. No evidence of distant metastasis.
3. Pelvic floor laxity with rectal prolapse.
4. Hiatal hernia.
5. Coronary artery atherosclerosis. Aortic Atherosclerosis
([id number]).
6. Fat containing ventral abdominal wall hernia.
7. Nephrolithiasis.
These results will be called to the ordering clinician or
representative by the Radiologist Assistant, and communication
documented in the PACS or zVision Dashboard.

## 2019-07-05 ENCOUNTER — Telehealth: Payer: Self-pay | Admitting: Oncology

## 2019-07-05 NOTE — Telephone Encounter (Signed)
Called patient regarding cancelling November appointment, per patient's request appointment has been cancelled. Patient will call back when ready to reschedule.

## 2019-07-09 ENCOUNTER — Ambulatory Visit: Payer: Medicare Other | Admitting: Oncology

## 2019-07-09 ENCOUNTER — Other Ambulatory Visit: Payer: Medicare Other

## 2020-09-04 DIAGNOSIS — Z85038 Personal history of other malignant neoplasm of large intestine: Secondary | ICD-10-CM | POA: Diagnosis not present

## 2020-09-04 DIAGNOSIS — C187 Malignant neoplasm of sigmoid colon: Secondary | ICD-10-CM | POA: Diagnosis not present

## 2020-09-04 DIAGNOSIS — C19 Malignant neoplasm of rectosigmoid junction: Secondary | ICD-10-CM | POA: Diagnosis not present

## 2020-09-04 DIAGNOSIS — E782 Mixed hyperlipidemia: Secondary | ICD-10-CM | POA: Diagnosis not present

## 2020-09-04 DIAGNOSIS — I10 Essential (primary) hypertension: Secondary | ICD-10-CM | POA: Diagnosis not present

## 2020-10-15 DIAGNOSIS — M9905 Segmental and somatic dysfunction of pelvic region: Secondary | ICD-10-CM | POA: Diagnosis not present

## 2020-10-15 DIAGNOSIS — M9904 Segmental and somatic dysfunction of sacral region: Secondary | ICD-10-CM | POA: Diagnosis not present

## 2020-10-15 DIAGNOSIS — M9903 Segmental and somatic dysfunction of lumbar region: Secondary | ICD-10-CM | POA: Diagnosis not present

## 2020-10-15 DIAGNOSIS — M62830 Muscle spasm of back: Secondary | ICD-10-CM | POA: Diagnosis not present

## 2020-10-22 DIAGNOSIS — M9905 Segmental and somatic dysfunction of pelvic region: Secondary | ICD-10-CM | POA: Diagnosis not present

## 2020-10-22 DIAGNOSIS — M62830 Muscle spasm of back: Secondary | ICD-10-CM | POA: Diagnosis not present

## 2020-10-22 DIAGNOSIS — M9904 Segmental and somatic dysfunction of sacral region: Secondary | ICD-10-CM | POA: Diagnosis not present

## 2020-10-22 DIAGNOSIS — M9903 Segmental and somatic dysfunction of lumbar region: Secondary | ICD-10-CM | POA: Diagnosis not present

## 2020-11-09 DIAGNOSIS — I10 Essential (primary) hypertension: Secondary | ICD-10-CM | POA: Diagnosis not present

## 2020-11-09 DIAGNOSIS — Z85038 Personal history of other malignant neoplasm of large intestine: Secondary | ICD-10-CM | POA: Diagnosis not present

## 2020-11-09 DIAGNOSIS — C187 Malignant neoplasm of sigmoid colon: Secondary | ICD-10-CM | POA: Diagnosis not present

## 2020-11-09 DIAGNOSIS — C19 Malignant neoplasm of rectosigmoid junction: Secondary | ICD-10-CM | POA: Diagnosis not present

## 2020-11-09 DIAGNOSIS — E782 Mixed hyperlipidemia: Secondary | ICD-10-CM | POA: Diagnosis not present

## 2020-11-19 DIAGNOSIS — M9904 Segmental and somatic dysfunction of sacral region: Secondary | ICD-10-CM | POA: Diagnosis not present

## 2020-11-19 DIAGNOSIS — M9903 Segmental and somatic dysfunction of lumbar region: Secondary | ICD-10-CM | POA: Diagnosis not present

## 2020-11-19 DIAGNOSIS — M9905 Segmental and somatic dysfunction of pelvic region: Secondary | ICD-10-CM | POA: Diagnosis not present

## 2020-11-19 DIAGNOSIS — M62830 Muscle spasm of back: Secondary | ICD-10-CM | POA: Diagnosis not present

## 2021-02-01 DIAGNOSIS — M9903 Segmental and somatic dysfunction of lumbar region: Secondary | ICD-10-CM | POA: Diagnosis not present

## 2021-02-01 DIAGNOSIS — M62830 Muscle spasm of back: Secondary | ICD-10-CM | POA: Diagnosis not present

## 2021-02-01 DIAGNOSIS — M9905 Segmental and somatic dysfunction of pelvic region: Secondary | ICD-10-CM | POA: Diagnosis not present

## 2021-02-01 DIAGNOSIS — M9904 Segmental and somatic dysfunction of sacral region: Secondary | ICD-10-CM | POA: Diagnosis not present

## 2021-02-03 DIAGNOSIS — M9903 Segmental and somatic dysfunction of lumbar region: Secondary | ICD-10-CM | POA: Diagnosis not present

## 2021-02-03 DIAGNOSIS — M62830 Muscle spasm of back: Secondary | ICD-10-CM | POA: Diagnosis not present

## 2021-02-03 DIAGNOSIS — M9905 Segmental and somatic dysfunction of pelvic region: Secondary | ICD-10-CM | POA: Diagnosis not present

## 2021-02-03 DIAGNOSIS — M9904 Segmental and somatic dysfunction of sacral region: Secondary | ICD-10-CM | POA: Diagnosis not present

## 2021-02-05 DIAGNOSIS — M62830 Muscle spasm of back: Secondary | ICD-10-CM | POA: Diagnosis not present

## 2021-02-05 DIAGNOSIS — M9905 Segmental and somatic dysfunction of pelvic region: Secondary | ICD-10-CM | POA: Diagnosis not present

## 2021-02-05 DIAGNOSIS — M9903 Segmental and somatic dysfunction of lumbar region: Secondary | ICD-10-CM | POA: Diagnosis not present

## 2021-02-05 DIAGNOSIS — M9904 Segmental and somatic dysfunction of sacral region: Secondary | ICD-10-CM | POA: Diagnosis not present

## 2021-02-08 DIAGNOSIS — M62830 Muscle spasm of back: Secondary | ICD-10-CM | POA: Diagnosis not present

## 2021-02-08 DIAGNOSIS — M9904 Segmental and somatic dysfunction of sacral region: Secondary | ICD-10-CM | POA: Diagnosis not present

## 2021-02-08 DIAGNOSIS — M9903 Segmental and somatic dysfunction of lumbar region: Secondary | ICD-10-CM | POA: Diagnosis not present

## 2021-02-08 DIAGNOSIS — M9905 Segmental and somatic dysfunction of pelvic region: Secondary | ICD-10-CM | POA: Diagnosis not present

## 2021-02-10 DIAGNOSIS — M62830 Muscle spasm of back: Secondary | ICD-10-CM | POA: Diagnosis not present

## 2021-02-10 DIAGNOSIS — M9903 Segmental and somatic dysfunction of lumbar region: Secondary | ICD-10-CM | POA: Diagnosis not present

## 2021-02-10 DIAGNOSIS — M9905 Segmental and somatic dysfunction of pelvic region: Secondary | ICD-10-CM | POA: Diagnosis not present

## 2021-02-10 DIAGNOSIS — M9904 Segmental and somatic dysfunction of sacral region: Secondary | ICD-10-CM | POA: Diagnosis not present

## 2021-02-16 DIAGNOSIS — M9904 Segmental and somatic dysfunction of sacral region: Secondary | ICD-10-CM | POA: Diagnosis not present

## 2021-02-16 DIAGNOSIS — M62830 Muscle spasm of back: Secondary | ICD-10-CM | POA: Diagnosis not present

## 2021-02-16 DIAGNOSIS — M9905 Segmental and somatic dysfunction of pelvic region: Secondary | ICD-10-CM | POA: Diagnosis not present

## 2021-02-16 DIAGNOSIS — M9903 Segmental and somatic dysfunction of lumbar region: Secondary | ICD-10-CM | POA: Diagnosis not present

## 2021-02-18 DIAGNOSIS — M9903 Segmental and somatic dysfunction of lumbar region: Secondary | ICD-10-CM | POA: Diagnosis not present

## 2021-02-18 DIAGNOSIS — M9905 Segmental and somatic dysfunction of pelvic region: Secondary | ICD-10-CM | POA: Diagnosis not present

## 2021-02-18 DIAGNOSIS — M9904 Segmental and somatic dysfunction of sacral region: Secondary | ICD-10-CM | POA: Diagnosis not present

## 2021-02-18 DIAGNOSIS — M62830 Muscle spasm of back: Secondary | ICD-10-CM | POA: Diagnosis not present

## 2021-02-24 DIAGNOSIS — M9904 Segmental and somatic dysfunction of sacral region: Secondary | ICD-10-CM | POA: Diagnosis not present

## 2021-02-24 DIAGNOSIS — M9905 Segmental and somatic dysfunction of pelvic region: Secondary | ICD-10-CM | POA: Diagnosis not present

## 2021-02-24 DIAGNOSIS — M62830 Muscle spasm of back: Secondary | ICD-10-CM | POA: Diagnosis not present

## 2021-02-24 DIAGNOSIS — M9903 Segmental and somatic dysfunction of lumbar region: Secondary | ICD-10-CM | POA: Diagnosis not present

## 2021-03-03 DIAGNOSIS — M9905 Segmental and somatic dysfunction of pelvic region: Secondary | ICD-10-CM | POA: Diagnosis not present

## 2021-03-03 DIAGNOSIS — M9904 Segmental and somatic dysfunction of sacral region: Secondary | ICD-10-CM | POA: Diagnosis not present

## 2021-03-03 DIAGNOSIS — M9903 Segmental and somatic dysfunction of lumbar region: Secondary | ICD-10-CM | POA: Diagnosis not present

## 2021-03-03 DIAGNOSIS — M62830 Muscle spasm of back: Secondary | ICD-10-CM | POA: Diagnosis not present

## 2021-03-17 DIAGNOSIS — M62830 Muscle spasm of back: Secondary | ICD-10-CM | POA: Diagnosis not present

## 2021-03-17 DIAGNOSIS — M9903 Segmental and somatic dysfunction of lumbar region: Secondary | ICD-10-CM | POA: Diagnosis not present

## 2021-03-17 DIAGNOSIS — M9905 Segmental and somatic dysfunction of pelvic region: Secondary | ICD-10-CM | POA: Diagnosis not present

## 2021-03-17 DIAGNOSIS — M9904 Segmental and somatic dysfunction of sacral region: Secondary | ICD-10-CM | POA: Diagnosis not present

## 2021-03-29 DIAGNOSIS — I10 Essential (primary) hypertension: Secondary | ICD-10-CM | POA: Diagnosis not present

## 2021-03-29 DIAGNOSIS — E782 Mixed hyperlipidemia: Secondary | ICD-10-CM | POA: Diagnosis not present

## 2021-03-31 DIAGNOSIS — M9904 Segmental and somatic dysfunction of sacral region: Secondary | ICD-10-CM | POA: Diagnosis not present

## 2021-03-31 DIAGNOSIS — M9903 Segmental and somatic dysfunction of lumbar region: Secondary | ICD-10-CM | POA: Diagnosis not present

## 2021-03-31 DIAGNOSIS — M62830 Muscle spasm of back: Secondary | ICD-10-CM | POA: Diagnosis not present

## 2021-03-31 DIAGNOSIS — M9905 Segmental and somatic dysfunction of pelvic region: Secondary | ICD-10-CM | POA: Diagnosis not present

## 2021-04-14 DIAGNOSIS — M9904 Segmental and somatic dysfunction of sacral region: Secondary | ICD-10-CM | POA: Diagnosis not present

## 2021-04-14 DIAGNOSIS — M62830 Muscle spasm of back: Secondary | ICD-10-CM | POA: Diagnosis not present

## 2021-04-14 DIAGNOSIS — M9905 Segmental and somatic dysfunction of pelvic region: Secondary | ICD-10-CM | POA: Diagnosis not present

## 2021-04-14 DIAGNOSIS — M9903 Segmental and somatic dysfunction of lumbar region: Secondary | ICD-10-CM | POA: Diagnosis not present

## 2021-05-12 DIAGNOSIS — E782 Mixed hyperlipidemia: Secondary | ICD-10-CM | POA: Diagnosis not present

## 2021-05-12 DIAGNOSIS — I10 Essential (primary) hypertension: Secondary | ICD-10-CM | POA: Diagnosis not present

## 2021-06-24 DIAGNOSIS — Z91038 Other insect allergy status: Secondary | ICD-10-CM | POA: Diagnosis not present

## 2021-06-24 DIAGNOSIS — Z0000 Encounter for general adult medical examination without abnormal findings: Secondary | ICD-10-CM | POA: Diagnosis not present

## 2021-06-24 DIAGNOSIS — E782 Mixed hyperlipidemia: Secondary | ICD-10-CM | POA: Diagnosis not present

## 2021-06-24 DIAGNOSIS — I10 Essential (primary) hypertension: Secondary | ICD-10-CM | POA: Diagnosis not present

## 2021-06-24 DIAGNOSIS — N3281 Overactive bladder: Secondary | ICD-10-CM | POA: Diagnosis not present

## 2021-06-24 DIAGNOSIS — Z85038 Personal history of other malignant neoplasm of large intestine: Secondary | ICD-10-CM | POA: Diagnosis not present

## 2021-06-24 DIAGNOSIS — Z1389 Encounter for screening for other disorder: Secondary | ICD-10-CM | POA: Diagnosis not present

## 2022-04-27 DIAGNOSIS — K625 Hemorrhage of anus and rectum: Secondary | ICD-10-CM | POA: Diagnosis not present

## 2022-05-30 DIAGNOSIS — D490 Neoplasm of unspecified behavior of digestive system: Secondary | ICD-10-CM | POA: Diagnosis not present

## 2022-05-30 DIAGNOSIS — C189 Malignant neoplasm of colon, unspecified: Secondary | ICD-10-CM | POA: Diagnosis not present

## 2022-05-30 DIAGNOSIS — Z85038 Personal history of other malignant neoplasm of large intestine: Secondary | ICD-10-CM | POA: Diagnosis not present

## 2022-05-30 DIAGNOSIS — K649 Unspecified hemorrhoids: Secondary | ICD-10-CM | POA: Diagnosis not present

## 2022-05-30 DIAGNOSIS — C188 Malignant neoplasm of overlapping sites of colon: Secondary | ICD-10-CM | POA: Diagnosis not present

## 2022-05-30 DIAGNOSIS — K5669 Other partial intestinal obstruction: Secondary | ICD-10-CM | POA: Diagnosis not present

## 2022-05-30 DIAGNOSIS — K921 Melena: Secondary | ICD-10-CM | POA: Diagnosis not present

## 2022-05-31 ENCOUNTER — Encounter: Payer: Self-pay | Admitting: *Deleted

## 2022-05-31 ENCOUNTER — Other Ambulatory Visit: Payer: Self-pay | Admitting: *Deleted

## 2022-05-31 DIAGNOSIS — K6389 Other specified diseases of intestine: Secondary | ICD-10-CM

## 2022-05-31 NOTE — Progress Notes (Signed)
CT Chest abdomen and pelvis ordered per Dr Benay Spice

## 2022-05-31 NOTE — Progress Notes (Signed)
PATIENT NAVIGATOR PROGRESS NOTE  Name: GREIDY SHERARD Date: 05/31/2022 MRN: [id number]  DOB: March 28, 1934   Reason for visit:  Scheduling  Comments:  Spoke with Ms Ruis and scheduled CT Chest abdomen and pelvis for 10/24 at 6 pm.  Will be NPO for hours prior to CT Will have labs drawn here at [facility] prior to [place] and will go to imaging for drinking of contrast at 4pm Referral placed to Dr Nadeen Landau for surgical consult    Time spent counseling/coordinating care: > 60 minutes

## 2022-05-31 NOTE — Progress Notes (Signed)
PATIENT NAVIGATOR PROGRESS NOTE  Name: Tracy Blackwell Date: 05/31/2022 MRN: [id number]  DOB: 12-10-1933   Reason for visit:  New Patient Appointment  Comments:  Hulen Skains and spoke with [name] regarding referral from Dr Therisa Doyne She states that she is having soft BMs with blood occasionally. Experiencing some mild cramping this am after her colonoscopy yesterday. Tolerating normal diet, no nausea. Discussed if she develops any abdominal pain, increased diarrhea, or nausea/vomiting to go to emergency room. Verbalized understanding Scheduled with Dr Benay Spice on Wednesday, 10/25 at 2:30, directions to building and parking reviewed as well as one support person in appointment. Verbalized understanding. Contact information given to call with any questions or issues Awaiting colonoscopy report and pathology results    Time spent counseling/coordinating care: > 60 minutes

## 2022-06-07 ENCOUNTER — Inpatient Hospital Stay: Payer: Medicare Other | Attending: Oncology

## 2022-06-07 ENCOUNTER — Ambulatory Visit (HOSPITAL_BASED_OUTPATIENT_CLINIC_OR_DEPARTMENT_OTHER)
Admission: RE | Admit: 2022-06-07 | Discharge: 2022-06-07 | Disposition: A | Discharge status: Home / Self Care | Payer: Medicare Other | Source: Ambulatory Visit | Attending: Oncology | Admitting: Oncology

## 2022-06-07 DIAGNOSIS — K6389 Other specified diseases of intestine: Secondary | ICD-10-CM | POA: Insufficient documentation

## 2022-06-07 DIAGNOSIS — Z85038 Personal history of other malignant neoplasm of large intestine: Secondary | ICD-10-CM | POA: Insufficient documentation

## 2022-06-07 DIAGNOSIS — N200 Calculus of kidney: Secondary | ICD-10-CM | POA: Diagnosis not present

## 2022-06-07 DIAGNOSIS — R911 Solitary pulmonary nodule: Secondary | ICD-10-CM | POA: Diagnosis not present

## 2022-06-07 DIAGNOSIS — N281 Cyst of kidney, acquired: Secondary | ICD-10-CM | POA: Diagnosis not present

## 2022-06-07 LAB — CMP (CANCER CENTER ONLY)
ALT: 15 U/L (ref 0–44)
AST: 25 U/L (ref 15–41)
Albumin: 4 g/dL (ref 3.5–5.0)
Alkaline Phosphatase: 77 U/L (ref 38–126)
Anion gap: 12 (ref 5–15)
BUN: 13 mg/dL (ref 8–23)
CO2: 26 mmol/L (ref 22–32)
Calcium: 9.7 mg/dL (ref 8.9–10.3)
Chloride: 100 mmol/L (ref 98–111)
Creatinine: 0.78 mg/dL (ref 0.44–1.00)
GFR, Estimated: >60 mL/min (ref >60)
Glucose, Bld: 100 mg/dL — ABNORMAL HIGH (ref 70–99)
Potassium: 3.2 mmol/L — ABNORMAL LOW (ref 3.5–5.1)
Sodium: 138 mmol/L (ref 135–145)
Total Bilirubin: 0.6 mg/dL (ref 0.3–1.2)
Total Protein: 6.6 g/dL (ref 6.5–8.1)

## 2022-06-07 LAB — CBC WITH DIFFERENTIAL (CANCER CENTER ONLY)
Abs Immature Granulocytes: 0.03 10*3/uL (ref 0.00–0.07)
Basophils Absolute: 0.1 10*3/uL (ref 0.0–0.1)
Basophils Relative: 1 %
Eosinophils Absolute: 0.1 10*3/uL (ref 0.0–0.5)
Eosinophils Relative: 2 %
HCT: 38.6 % (ref 36.0–46.0)
Hemoglobin: 12.4 g/dL (ref 12.0–15.0)
Immature Granulocytes: 0 %
Lymphocytes Relative: 14 %
Lymphs Abs: 1 10*3/uL (ref 0.7–4.0)
MCH: 28.1 pg (ref 26.0–34.0)
MCHC: 32.1 g/dL (ref 30.0–36.0)
MCV: 87.3 fL (ref 80.0–100.0)
Monocytes Absolute: 0.5 10*3/uL (ref 0.1–1.0)
Monocytes Relative: 8 %
Neutro Abs: 5.4 10*3/uL (ref 1.7–7.7)
Neutrophils Relative %: 75 %
Platelet Count: 339 10*3/uL (ref 150–400)
RBC: 4.42 MIL/uL (ref 3.87–5.11)
RDW: 15.4 % (ref 11.5–15.5)
WBC Count: 7.1 10*3/uL (ref 4.0–10.5)
nRBC: 0 % (ref 0.0–0.2)

## 2022-06-07 LAB — CEA (ACCESS): CEA (CHCC): 2.63 ng/mL (ref 0.00–5.00)

## 2022-06-07 MED ORDER — IOHEXOL 300 MG/ML  SOLN
100.0000 mL | Freq: Once | INTRAMUSCULAR | Status: AC | PRN
  Administered 2022-06-07: 100 mL via INTRAVENOUS

## 2022-06-08 ENCOUNTER — Inpatient Hospital Stay: Payer: Medicare Other | Admitting: Oncology

## 2022-06-08 ENCOUNTER — Encounter: Payer: Self-pay | Admitting: *Deleted

## 2022-06-08 VITALS — BP 145/80 | HR 89 | Temp 98.2°F | Resp 18 | Ht 62.0 in | Wt 153.0 lb

## 2022-06-08 DIAGNOSIS — Z9049 Acquired absence of other specified parts of digestive tract: Secondary | ICD-10-CM | POA: Diagnosis not present

## 2022-06-08 DIAGNOSIS — I10 Essential (primary) hypertension: Secondary | ICD-10-CM

## 2022-06-08 DIAGNOSIS — K6389 Other specified diseases of intestine: Secondary | ICD-10-CM

## 2022-06-08 DIAGNOSIS — Z85038 Personal history of other malignant neoplasm of large intestine: Secondary | ICD-10-CM

## 2022-06-08 DIAGNOSIS — R194 Change in bowel habit: Secondary | ICD-10-CM

## 2022-06-08 DIAGNOSIS — K625 Hemorrhage of anus and rectum: Secondary | ICD-10-CM

## 2022-06-08 NOTE — Progress Notes (Signed)
PATIENT NAVIGATOR PROGRESS NOTE  Name: SEQUITA WISE Date: 06/08/2022 MRN: [id number]  DOB: Feb 26, 1934   Reason for visit:  New Patient appt  Comments:  Met with Mrs Pontillo and her daughter during visit with Dr Benay Spice  She is meeting with Dr Dema Severin, surgeon on October 30 for consult Will add her to GI conference on 11/1 Will continue to follow for surgical date Given contact information to call with any questions or issues    Time spent counseling/coordinating care: > 60 minutes

## 2022-06-08 NOTE — Progress Notes (Signed)
[facility] New Patient Consult   Requesting MD: Ronnette Juniper, Md [address],  [place] [address]   Tracy Blackwell 88 y.o.  29-Dec-1933    Reason for Consult: Colon cancer   HPI: Tracy Blackwell was diagnosed with colon cancer in 2018.  Colonoscopy 07/17/2018 revealed a partially obstructing mass in the sigmoid colon at 25 cm from the anal verge.  A biopsy confirmed moderately differentiated adenocarcinoma.  She was referred to Dr. Dema Severin and underwent a laparoscopic anterior resection with colorectal anastomosis on 08/14/2017.  There was no evidence of metastatic disease at the time of surgery.  The anastomosis was at 15 cm from the anal verge.  The pathology confirmed a stage II (T3N0) moderately differentiated adenocarcinoma with 22 negative lymph nodes.  The tumor was microsatellite stable with no loss of mismatch repair protein expression.  She underwent a sigmoidoscopy 05/30/2022.  An obstructing mass was noted 10 cm from the anal verge with a biopsy confirming invasive adenocarcinoma.  She is scheduled to see Dr. Dema Severin on 06/13/2022.  Tracy Blackwell feels well    Past Medical History:  Diagnosis Date   Cancer (HCC)-sigmoid colon 2018      Hypertension    Pneumonia    age 36 months old    .  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, 1 stillborn  Past Surgical History:  Procedure Laterality Date   CATARACT EXTRACTION, BILATERAL     COLONOSCOPY     CYSTOSCOPY WITH STENT PLACEMENT Bilateral 08/14/2017   Procedure: CYSTOSCOPY WITH BILATERAL URETERAL CATHETER PLACEMENT;  Surgeon: Ceasar Mons, MD;  Location: [facility];  Service: Urology;  Laterality: Bilateral;   FLEXIBLE SIGMOIDOSCOPY N/A 08/14/2017   Procedure: FLEXIBLE SIGMOIDOSCOPY;  Surgeon: Ileana Roup, MD;  Location: [facility];  Service: General;  Laterality: N/A;   LAPAROSCOPIC LOW ANTERIOR RESECTION N/A 08/14/2017   Procedure: LAPAROSCOPIC ASSISTED ANTERIOR RESECTION;  Surgeon: Ileana Roup, MD;   Location: [facility];  Service: General;  Laterality: N/A;  ERAS PATHWAY   UMBILICAL HERNIA REPAIR N/A 08/14/2017   Procedure: UMBILICAL HERNIA REPAIR;  Surgeon: Ileana Roup, MD;  Location: [facility];  Service: General;  Laterality: N/A;    Medications: Reviewed  Allergies:  Allergies  Allergen Reactions   Wasp Venom Anaphylaxis   Codeine Nausea And Vomiting    Family history: No family history of cancer  Social History:   She lives with her son in [place].  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  She currently works part-time.  No cigarette use.  She reports social alcohol use.  No transfusion history.  No risk factor for hepatitis.  ROS:   Positives include: Frequent urination, loose stool for the past several months, bleeding when she wipes-hemorrhoid?  A complete ROS was otherwise negative.  Physical Exam:  Blood pressure (!) 145/80, pulse 89, temperature 98.2 F (36.8 C), temperature source Oral, resp. rate 18, height 5' 2"  (1.575 m), weight 153 lb (69.4 kg), SpO2 100 %.  HEENT: Oropharynx without visible mass, neck without mass Lungs: End inspiratory rhonchi at the left upper and lower posterior chest, no respiratory distress Cardiac: Regular rate and rhythm Abdomen: Nontender, no mass, no hepatosplenomegaly  Vascular: No leg edema Lymph nodes: No cervical, supraclavicular, axillary, or inguinal nodes Neurologic: Alert and oriented, the motor exam appears intact in the upper lower extremities bilaterally Skin: No rash, multiple benign-appearing moles over the trunk Musculoskeletal: No spine tenderness   LAB:  CBC  Lab Results  Component Value Date   WBC 7.1 06/07/2022   HGB 12.4 06/07/2022   HCT 38.6 06/07/2022   MCV 87.3 06/07/2022   PLT 339 06/07/2022   NEUTROABS 5.4 06/07/2022        CMP  Lab Results  Component Value Date   NA 138 06/07/2022   K 3.2 (L) 06/07/2022   CL 100 06/07/2022   CO2 26 06/07/2022   GLUCOSE 100 (H) 06/07/2022    BUN 13 06/07/2022   CREATININE 0.78 06/07/2022   CALCIUM 9.7 06/07/2022   PROT 6.6 06/07/2022   ALBUMIN 4.0 06/07/2022   AST 25 06/07/2022   ALT 15 06/07/2022   ALKPHOS 77 06/07/2022   BILITOT 0.6 06/07/2022   GFRNONAA >60 06/07/2022   GFRAA >60 08/16/2017    CEA on 1024 23-2 0.63 Imaging:  CT CHEST ABDOMEN PELVIS W CONTRAST  Result Date: 06/08/2022 CLINICAL DATA:  History of colon cancer.  Restaging. * Tracking Code: BO * EXAM: CT CHEST, ABDOMEN, AND PELVIS WITH CONTRAST TECHNIQUE: Multidetector CT imaging of the chest, abdomen and pelvis was performed following the standard protocol during bolus administration of intravenous contrast. RADIATION DOSE REDUCTION: This exam was performed according to the departmental dose-optimization program which includes automated exposure control, adjustment of the mA and/or kV according to patient size and/or use of iterative reconstruction technique. CONTRAST:  116m OMNIPAQUE IOHEXOL 300 MG/ML  SOLN COMPARISON:  CT chest 08/04/2017 and CT AP 07/05/2017 FINDINGS: CT CHEST FINDINGS Cardiovascular: The heart size appears within normal limits. No pericardial effusion identified. Aortic atherosclerosis and coronary artery calcifications. Mediastinum/Nodes: No enlarged mediastinal, hilar, or axillary lymph nodes. Thyroid gland, trachea, and esophagus demonstrate no significant findings. Lungs/Pleura: No pleural effusion, airspace consolidation, atelectasis, or pneumothorax. Scarring and architectural distortion identified within the lung bases. Tiny peripheral nodule in the left upper lobe is stable measuring 3 mm, image 44/4. This is compatible with a benign nodule. No suspicious lung nodules identified at this time. Musculoskeletal: Thoracic spondylosis. Severe degenerative changes identified within both glenohumeral joints. CT ABDOMEN PELVIS FINDINGS Hepatobiliary: No suspicious liver abnormality. Normal gallbladder. No bile duct dilatation. Pancreas:  Unremarkable. No pancreatic ductal dilatation or surrounding inflammatory changes. Spleen: Normal in size without focal abnormality. Adrenals/Urinary Tract: Normal adrenal glands. Small bilateral renal calculi are noted measuring up to 3 mm. Multiple fluid attenuating cysts are identified bilaterally compatible with benign Bosniak class 1 lesions. The largest arises off the inferior pole the left kidney measuring 4.9 cm. There also scattered too small to characterize (Bosniak class 2 kidney cysts. No follow-up imaging recommended. Urinary bladder is unremarkable. Stomach/Bowel: Tiny hiatal hernia. The appendix is visualized and appears normal. No pathologic dilatation of the large or small bowel loops. Signs of previous distal colonic resection with colorectal anastomotic suture line within the left hemipelvis, image 98/2. There is wall thickening involving the 5 cm of bowel proximal suture chain as well as the entirety of the rectum, image 72/6. Mild soft tissue stranding is noted within the adjacent mesocolon. Vascular/Lymphatic: Aortic atherosclerosis. No enlarged abdominal or pelvic lymph nodes. Small perirectal lymph nodes are noted within the pelvis measuring up to 8 mm, image 99/2. Reproductive: Hysterectomy. Pessary device noted. Right upper quadrant abdominal wall hernia is identified which contains fat only, image 57/2. Other: No abdominal wall hernia or abnormality. No abdominopelvic ascites. Musculoskeletal: No acute or significant osseous findings. IMPRESSION: 1. Signs of previous distal colonic resection with colorectal anastomotic suture line within the left hemipelvis. There is wall thickening involving the 5 cm  of bowel proximal suture chain as well as the entirety of the rectum. Mild soft tissue stranding is noted within the adjacent mesocolon. Additionally, small perirectal lymph nodes are noted within the pelvis measuring up to 8 mm. Underlying locally recurrent tumor would be difficult to  exclude. Consider further evaluation with direct visualization. 2. No signs of distant metastatic disease within the chest, abdomen or pelvis. 3. Bilateral nonobstructing renal calculi. 4. Right upper quadrant abdominal wall hernia contains fat only. 5. Multiple fluid attenuating cysts are identified bilaterally compatible with benign Bosniak class I and II lesions. No follow-up imaging recommended. 6.  Aortic Atherosclerosis (ICD10-I70.0). Electronically Signed   By: Kerby Moors M.D.   On: 06/08/2022 09:08      Assessment/Plan:   Adenocarcinoma of the colon Partially obstructing mass at 25 cm on colonoscopy 07/17/2017 with a biopsy confirming moderately differentiated adenocarcinoma, incomplete colonoscopy CT abdomen/pelvis 07/05/2017-rectosigmoid mass with evidence of transmural spread and regional adenopathy, no evidence of distant metastatic disease Laparoscopic anterior resection 08/14/2017, stage II (T3N0) moderately differentiated adenocarcinoma, 0/22 lymphnodes positive for metastatic carcinoma, MSI-stable, no loss of mismatch repair protein expression Colonoscopy 10/23/2017-diverticulosis nonbleeding internal hemorrhoids  Sigmoidoscopy 05/30/2022-partially obstructing large mass at 10 cm proximal to the anus-circumferential, the scope could not be advanced beyond the mass-invasive moderately differentiated adenocarcinoma, no loss of mismatch repair protein expression CTs 06/08/2022-distal colonic resection with an anastomotic suture line in the left pelvis, wall thickening involving 5 cm of bowel proximal to the suture line in the entire rectum with soft tissue stranding in the adjacent mesocolon, small perirectal lymph nodes with no evidence of distant metastatic disease, nonobstructing renal calculi, multiple renal cyst  Hypertension Loose stool and rectal bleeding-likely secondary to #1     Disposition:   Tracy Blackwell was diagnosed with stage II colon cancer in December 2018.  She  underwent a laparoscopic anterior resection 08/14/2018 for a stage II microsatellite stable tumor.  She did not receive adjuvant therapy.  She has loose stool and rectal bleeding.  She was found to have a partially obstructing mass on a sigmoidoscopy 05/30/2022.  The mass likely represents a local recurrence of the 2018 sigmoid colon tumor.  There is no clinical or radiologic evidence of distant metastatic disease.  She is scheduled to see Dr. Dema Severin next week.  I discussed the probable diagnosis of a local tumor recurrence versus a new primary tumor with Tracy Blackwell and her daughter.  She will see Dr. Dema Severin to discuss surgical options.  I will present her case at the GI tumor conference next week.  Dr. Dema Severin may recommend a staging pelvic MRI.  I will plan to see her after surgery or sooner if neoadjuvant therapy is recommended.    Betsy Coder, MD  06/08/2022, 3:49 PM

## 2022-06-13 ENCOUNTER — Ambulatory Visit (HOSPITAL_COMMUNITY)
Admission: RE | Admit: 2022-06-13 | Discharge: 2022-06-13 | Disposition: A | Discharge status: Home / Self Care | Payer: Medicare Other | Source: Ambulatory Visit | Attending: Surgery | Admitting: Surgery

## 2022-06-13 ENCOUNTER — Other Ambulatory Visit (HOSPITAL_COMMUNITY): Payer: Self-pay | Admitting: Surgery

## 2022-06-13 ENCOUNTER — Other Ambulatory Visit: Payer: Self-pay | Admitting: Surgery

## 2022-06-13 DIAGNOSIS — N281 Cyst of kidney, acquired: Secondary | ICD-10-CM | POA: Diagnosis not present

## 2022-06-13 DIAGNOSIS — C20 Malignant neoplasm of rectum: Secondary | ICD-10-CM | POA: Insufficient documentation

## 2022-06-13 DIAGNOSIS — M5137 Other intervertebral disc degeneration, lumbosacral region: Secondary | ICD-10-CM | POA: Diagnosis not present

## 2022-06-13 DIAGNOSIS — K6389 Other specified diseases of intestine: Secondary | ICD-10-CM | POA: Diagnosis not present

## 2022-06-15 ENCOUNTER — Other Ambulatory Visit: Payer: Self-pay

## 2022-06-15 NOTE — Progress Notes (Signed)
The proposed treatment discussed in conference is for discussion purpose only and is not a binding recommendation.  The patients have not been physically examined, or presented with their treatment options.  Therefore, final treatment plans cannot be decided.  

## 2022-06-20 DIAGNOSIS — C189 Malignant neoplasm of colon, unspecified: Secondary | ICD-10-CM | POA: Diagnosis not present

## 2022-06-21 ENCOUNTER — Other Ambulatory Visit: Payer: Self-pay | Admitting: Urology

## 2022-07-06 DIAGNOSIS — Z91038 Other insect allergy status: Secondary | ICD-10-CM | POA: Diagnosis not present

## 2022-07-06 DIAGNOSIS — Z85038 Personal history of other malignant neoplasm of large intestine: Secondary | ICD-10-CM | POA: Diagnosis not present

## 2022-07-06 DIAGNOSIS — Z0000 Encounter for general adult medical examination without abnormal findings: Secondary | ICD-10-CM | POA: Diagnosis not present

## 2022-07-06 DIAGNOSIS — E782 Mixed hyperlipidemia: Secondary | ICD-10-CM | POA: Diagnosis not present

## 2022-07-06 DIAGNOSIS — Z23 Encounter for immunization: Secondary | ICD-10-CM | POA: Diagnosis not present

## 2022-07-06 DIAGNOSIS — I10 Essential (primary) hypertension: Secondary | ICD-10-CM | POA: Diagnosis not present

## 2022-07-06 DIAGNOSIS — N3281 Overactive bladder: Secondary | ICD-10-CM | POA: Diagnosis not present

## 2022-07-15 NOTE — Progress Notes (Signed)
Sent message, via epic in basket, requesting orders in epic from surgeon.  

## 2022-07-18 ENCOUNTER — Ambulatory Visit: Payer: Medicare Other | Admitting: Oncology

## 2022-07-20 ENCOUNTER — Ambulatory Visit: Payer: Self-pay | Admitting: Surgery

## 2022-07-20 DIAGNOSIS — Z01818 Encounter for other preprocedural examination: Secondary | ICD-10-CM

## 2022-07-20 NOTE — Progress Notes (Signed)
Second request for pre op orders spoke with Elmyra Ricks

## 2022-07-21 ENCOUNTER — Ambulatory Visit: Payer: Medicare Other | Admitting: Oncology

## 2022-07-21 NOTE — Patient Instructions (Addendum)
DUE TO COVID-19 ONLY TWO VISITORS  (aged 86 and older)  ARE ALLOWED TO COME WITH YOU AND STAY IN THE WAITING ROOM ONLY DURING PRE OP AND PROCEDURE.   **NO VISITORS ARE ALLOWED IN THE SHORT STAY AREA OR RECOVERY ROOM!!**  IF YOU WILL BE ADMITTED INTO THE HOSPITAL YOU ARE ALLOWED ONLY FOUR SUPPORT PEOPLE DURING VISITATION HOURS ONLY (7 AM -8PM)   The support person(s) must pass our screening, gel in and out, and wear a mask at all times, including in the patient's room. Patients must also wear a mask when staff or their support person are in the room. Visitors GUEST BADGE MUST BE WORN VISIBLY  One adult visitor may remain with you overnight and MUST be in the room by 8 P.M.     Your procedure is scheduled on: 07/29/22   Report to [facility] Main Entrance    Report to admitting at  5:15 AM   Call this number if you have problems the morning of surgery [phone]   Do not eat food :After Midnight.   After Midnight you may have the following liquids until __4:30____ AM/  DAY OF SURGERY  Water Black Coffee (sugar ok, NO MILK/CREAM OR CREAMERS)  Tea (sugar ok, NO MILK/CREAM OR CREAMERS) regular and decaf                             Plain Jell-O (NO RED)                                           Fruit ices (not with fruit pulp, NO RED)                                     Popsicles (NO RED)                                                                  Juice: apple, WHITE grape, WHITE cranberry Sports drinks like Gatorade (NO RED)              Drink 2 Ensure/G2 drinks AT 10:00 PM the night before surgery.        The day of surgery:  Drink ONE (1) Pre-Surgery Clear Ensure  at 4:15  AM the morning of surgery. Drink in one sitting. Do not sip.  This drink was given to you during your hospital  pre-op appointment visit. Nothing else to drink after completing the  Pre-Surgery Clear Ensure at 4:30 AM          If you have questions, please contact your surgeon's  office.   FOLLOW BOWEL PREP AND ANY ADDITIONAL PRE OP INSTRUCTIONS YOU RECEIVED FROM YOUR SURGEON'S OFFICE!!!              Drink plenty of fluids on bowel prep day to prevent dehydration   Oral Hygiene is also important to reduce your risk of infection.  Remember - BRUSH YOUR TEETH THE MORNING OF SURGERY WITH YOUR REGULAR TOOTHPASTE  DENTURES WILL BE REMOVED PRIOR TO SURGERY PLEASE DO NOT APPLY "Poly grip" OR ADHESIVES!!!   Do NOT smoke after Midnight   Take these medicines the morning of surgery with A SIP OF WATER: Metoprolol                                                                                                                            Amlodipine   Bring CPAP mask and tubing day of surgery.                              You may not have any metal on your body including hair pins, jewelry, and body piercing             Do not wear make-up, lotions, powders, perfumes/cologne, or deodorant  Do not wear nail polish including gel and S&S, artificial/acrylic nails, or any other type of covering on natural nails including finger and toenails. If you have artificial nails, gel coating, etc. that needs to be removed by a nail salon please have this removed prior to surgery or surgery may need to be canceled/ delayed if the surgeon/ anesthesia feels like they are unable to be safely monitored.   Do not shave  48 hours prior to surgery.     Do not bring valuables to the hospital. [place].   Contacts, glasses, or bridgework may not be worn into surgery.   Bring small overnight bag day of surgery.   DO NOT [place]. PHARMACY WILL DISPENSE MEDICATIONS LISTED ON YOUR MEDICATION LIST TO YOU DURING YOUR ADMISSION [place]!     Special Instructions: Bring a copy of your healthcare power of attorney and living will documents  the day of surgery if you haven't  scanned them before.              Please read over the following fact sheets you were given: IF YOU HAVE QUESTIONS ABOUT YOUR PRE-OP INSTRUCTIONS PLEASE CALL [phone]    [facility] - Preparing for Surgery Before surgery, you can play an important role.  Because skin is not sterile, your skin needs to be as free of germs as possible.  You can reduce the number of germs on your skin by washing with CHG (chlorahexidine gluconate) soap before surgery.  CHG is an antiseptic cleaner which kills germs and bonds with the skin to continue killing germs even after washing. Please DO NOT use if you have an allergy to CHG or antibacterial soaps.  If your skin becomes reddened/irritated stop using the CHG and inform your nurse when you arrive at Short Stay. Do not shave (including legs and underarms) for at least 48 hours prior  to the first CHG shower.   Please follow these instructions carefully:  1.  Shower with CHG Soap the night before surgery and the  morning of Surgery.  2.  If you choose to wash your hair, wash your hair first as usual with your  normal  shampoo.  3.  After you shampoo, rinse your hair and body thoroughly to remove the  shampoo.                            4.  Use CHG as you would any other liquid soap.  You can apply chg directly  to the skin and wash                       Gently with a scrungie or clean washcloth.  5.  Apply the CHG Soap to your body ONLY FROM THE NECK DOWN.   Do not use on face/ open                           Wound or open sores. Avoid contact with eyes, ears mouth and genitals (private parts).                       Wash face,  Genitals (private parts) with your normal soap.             6.  Wash thoroughly, paying special attention to the area where your surgery  will be performed.  7.  Thoroughly rinse your body with warm water from the neck down.  8.  DO NOT shower/wash with your normal soap after using and rinsing off  the CHG Soap.                9.  Pat  yourself dry with a clean towel.            10.  Wear clean pajamas.            11.  Place clean sheets on your bed the night of your first shower and do not  sleep with pets. Day of Surgery : Do not apply any lotions/deodorants the morning of surgery.  Please wear clean clothes to the hospital/surgery center.  FAILURE TO FOLLOW THESE INSTRUCTIONS MAY RESULT IN THE CANCELLATION OF YOUR SURGERY  ________________________________________________________________________  Incentive Spirometer  An incentive spirometer is a tool that can help keep your lungs clear and active. This tool measures how well you are filling your lungs with each breath. Taking long deep breaths may help reverse or decrease the chance of developing breathing (pulmonary) problems (especially infection) following: A long period of time when you are unable to move or be active. BEFORE THE PROCEDURE  If the spirometer includes an indicator to show your best effort, your nurse or respiratory therapist will set it to a desired goal. If possible, sit up straight or lean slightly forward. Try not to slouch. Hold the incentive spirometer in an upright position. INSTRUCTIONS FOR USE  Sit on the edge of your bed if possible, or sit up as far as you can in bed or on a chair. Hold the incentive spirometer in an upright position. Breathe out normally. Place the mouthpiece in your mouth and seal your lips tightly around it. Breathe in slowly and as deeply as possible, raising the piston or the ball toward the top of the column. Hold  your breath for 3-5 seconds or for as long as possible. Allow the piston or ball to fall to the bottom of the column. Remove the mouthpiece from your mouth and breathe out normally. Rest for a few seconds and repeat Steps 1 through 7 at least 10 times every 1-2 hours when you are awake. Take your time and take a few normal breaths between deep breaths. The spirometer may include an indicator to show your  best effort. Use the indicator as a goal to work toward during each repetition. After each set of 10 deep breaths, practice coughing to be sure your lungs are clear. If you have an incision (the cut made at the time of surgery), support your incision when coughing by placing a pillow or rolled up towels firmly against it. Once you are able to get out of bed, walk around indoors and cough well. You may stop using the incentive spirometer when instructed by your caregiver.  RISKS AND COMPLICATIONS Take your time so you do not get dizzy or light-headed. If you are in pain, you may need to take or ask for pain medication before doing incentive spirometry. It is harder to take a deep breath if you are having pain. AFTER USE Rest and breathe slowly and easily. It can be helpful to keep track of a log of your progress. Your caregiver can provide you with a simple table to help with this. If you are using the spirometer at home, follow these instructions: [place] IF:  You are having difficultly using the spirometer. You have trouble using the spirometer as often as instructed. Your pain medication is not giving enough relief while using the spirometer. You develop fever of 100.5 F (38.1 C) or higher. SEEK IMMEDIATE MEDICAL CARE IF:  You cough up bloody sputum that had not been present before. You develop fever of 102 F (38.9 C) or greater. You develop worsening pain at or near the incision site. MAKE SURE YOU:  Understand these instructions. Will watch your condition. Will get help right away if you are not doing well or get worse. Document Released: 12/12/2006 Document Revised: 10/24/2011 Document Reviewed: 02/12/2007 [facility] Patient Information 2014 [place], [place].   ________________________________________________________________________

## 2022-07-22 ENCOUNTER — Other Ambulatory Visit: Payer: Self-pay

## 2022-07-22 ENCOUNTER — Encounter (HOSPITAL_COMMUNITY): Payer: Self-pay

## 2022-07-22 ENCOUNTER — Encounter (HOSPITAL_COMMUNITY)
Admission: RE | Admit: 2022-07-22 | Discharge: 2022-07-22 | Disposition: A | Discharge status: Home / Self Care | Payer: Medicare Other | Source: Ambulatory Visit | Attending: Surgery | Admitting: Surgery

## 2022-07-22 DIAGNOSIS — Z01818 Encounter for other preprocedural examination: Secondary | ICD-10-CM | POA: Diagnosis not present

## 2022-07-22 DIAGNOSIS — R9431 Abnormal electrocardiogram [ECG] [EKG]: Secondary | ICD-10-CM | POA: Diagnosis not present

## 2022-07-22 LAB — COMPREHENSIVE METABOLIC PANEL
ALT: 14 U/L (ref 0–44)
AST: 25 U/L (ref 15–41)
Albumin: 3.5 g/dL (ref 3.5–5.0)
Alkaline Phosphatase: 73 U/L (ref 38–126)
Anion gap: 10 (ref 5–15)
BUN: 19 mg/dL (ref 8–23)
CO2: 25 mmol/L (ref 22–32)
Calcium: 9.4 mg/dL (ref 8.9–10.3)
Chloride: 106 mmol/L (ref 98–111)
Creatinine, Ser: 0.82 mg/dL (ref 0.44–1.00)
GFR, Estimated: >60 mL/min (ref >60)
Glucose, Bld: 108 mg/dL — ABNORMAL HIGH (ref 70–99)
Potassium: 4.2 mmol/L (ref 3.5–5.1)
Sodium: 141 mmol/L (ref 135–145)
Total Bilirubin: 0.7 mg/dL (ref 0.3–1.2)
Total Protein: 6.9 g/dL (ref 6.5–8.1)

## 2022-07-22 LAB — CBC WITH DIFFERENTIAL/PLATELET
Abs Immature Granulocytes: 0.03 10*3/uL (ref 0.00–0.07)
Basophils Absolute: 0.1 10*3/uL (ref 0.0–0.1)
Basophils Relative: 1 %
Eosinophils Absolute: 0.2 10*3/uL (ref 0.0–0.5)
Eosinophils Relative: 2 %
HCT: 41.1 % (ref 36.0–46.0)
Hemoglobin: 12.8 g/dL (ref 12.0–15.0)
Immature Granulocytes: 0 %
Lymphocytes Relative: 12 %
Lymphs Abs: 0.9 10*3/uL (ref 0.7–4.0)
MCH: 27.9 pg (ref 26.0–34.0)
MCHC: 31.1 g/dL (ref 30.0–36.0)
MCV: 89.5 fL (ref 80.0–100.0)
Monocytes Absolute: 0.7 10*3/uL (ref 0.1–1.0)
Monocytes Relative: 10 %
Neutro Abs: 5.5 10*3/uL (ref 1.7–7.7)
Neutrophils Relative %: 75 %
Platelets: 320 10*3/uL (ref 150–400)
RBC: 4.59 MIL/uL (ref 3.87–5.11)
RDW: 16.7 % — ABNORMAL HIGH (ref 11.5–15.5)
WBC: 7.4 10*3/uL (ref 4.0–10.5)
nRBC: 0 % (ref 0.0–0.2)

## 2022-07-22 NOTE — Progress Notes (Addendum)
Anesthesia note:  Bowel prep reminder:  yes. Reviewed with Pt  PCP - Dr. Lenard Simmer Cardiologist -none Other-   Chest x-ray - 06/07/22-epic EKG - 07/22/22-epic Stress Test - no ECHO - no Cardiac Cath - no CABG-no Pacemaker/ICD device last checked:NA  Sleep Study - no CPAP -   Pt is pre diabetic-NA CBG at PAT visit- Fasting Blood Sugar at home- Checks Blood Sugar _____  Blood Thinner:no Blood Thinner Instructions: Aspirin Instructions: Last Dose:  Anesthesia review:  /No    Patient denies shortness of breath, fever, cough and chest pain at PAT appointment. Pt reports no SOB with activities   Patient verbalized understanding of instructions that were given to them at the PAT appointment. Patient was also instructed that they will need to review over the PAT instructions again at home before surgery.yes

## 2022-07-22 NOTE — Consult Note (Addendum)
[place] Nurse ostomy consult note  [place] Nurse requested for preoperative stoma site marking by Dr. Dema Severin. This patient is known to me from her previous hospitalization 3 years ago.  Discussed surgical procedure and stoma creation with patient.  Explained role of the [place] nurse team.  Answered patient questions.   Examined patient sitting and standing in order to place the marking in the patient's visual field, away from any creases or abdominal contour issues and within the rectus muscle.   Marked for colostomy in the LLQ  7 cm to the left of the umbilicus and 4.4BE below the umbilicus.  Patient's abdomen cleansed with CHG wipes at site marking, allowed to air dry prior to marking. Covered mark with thin film transparent dressing to preserve mark until date of surgery.   [place] Nurse team will follow up with patient after surgery for continue ostomy care and teaching should a stoma be created intraoperatively on the 15th.   Thank you for inviting [place] to participate in this very nice patient's Plan of Care.  Maudie Flakes, MSN, RN, CNS, [place], Serita Grammes, [employer], [employer] phone:  [phone]

## 2022-07-28 NOTE — Anesthesia Preprocedure Evaluation (Addendum)
Anesthesia Evaluation  Patient identified by MRN, date of birth, ID band Patient awake    Reviewed: Allergy & Precautions, NPO status , Patient's Chart, lab work & pertinent test results, reviewed documented beta blocker date and time   History of Anesthesia Complications Negative for: history of anesthetic complications  Airway Mallampati: I  TM Distance: >3 FB Neck ROM: Full    Dental  (+) Dental Advisory Given   Pulmonary neg pulmonary ROS   breath sounds clear to auscultation       Cardiovascular hypertension, Pt. on medications and Pt. on home beta blockers (-) angina  Rhythm:Regular Rate:Normal     Neuro/Psych negative neurological ROS     GI/Hepatic Neg liver ROS,,,Rectosigmoid cancer   Endo/Other  negative endocrine ROS    Renal/GU negative Renal ROS     Musculoskeletal   Abdominal   Peds  Hematology negative hematology ROS (+)   Anesthesia Other Findings   Reproductive/Obstetrics                              Anesthesia Physical Anesthesia Plan  ASA: 3  Anesthesia Plan: General   Post-op Pain Management: Tylenol PO (pre-op)*   Induction: Intravenous  PONV Risk Score and Plan: 3 and Ondansetron, Dexamethasone and Treatment may vary due to age or medical condition  Airway Management Planned: Oral ETT  Additional Equipment: None  Intra-op Plan:   Post-operative Plan: Extubation in OR  Informed Consent: I have reviewed the patients History and Physical, chart, labs and discussed the procedure including the risks, benefits and alternatives for the proposed anesthesia with the patient or authorized representative who has indicated his/her understanding and acceptance.     Dental advisory given  Plan Discussed with: CRNA and Surgeon  Anesthesia Plan Comments: (Discussed with patient and her daughter)         Anesthesia Quick Evaluation

## 2022-07-29 ENCOUNTER — Inpatient Hospital Stay (HOSPITAL_COMMUNITY): Payer: Medicare Other | Admitting: Certified Registered Nurse Anesthetist

## 2022-07-29 ENCOUNTER — Encounter (HOSPITAL_COMMUNITY): Payer: Self-pay | Admitting: Surgery

## 2022-07-29 ENCOUNTER — Encounter (HOSPITAL_COMMUNITY)
Admission: RE | Disposition: A | Discharge status: Home / Self Care | Payer: Self-pay | Source: Home / Self Care | Attending: Surgery

## 2022-07-29 ENCOUNTER — Inpatient Hospital Stay (HOSPITAL_COMMUNITY)
Admission: RE | Admit: 2022-07-29 | Discharge: 2022-07-31 | DRG: 331 | Disposition: A | Discharge status: Home / Self Care | Payer: Medicare Other | Attending: Surgery | Admitting: Surgery

## 2022-07-29 ENCOUNTER — Other Ambulatory Visit: Payer: Self-pay

## 2022-07-29 DIAGNOSIS — Z8701 Personal history of pneumonia (recurrent): Secondary | ICD-10-CM

## 2022-07-29 DIAGNOSIS — K620 Anal polyp: Secondary | ICD-10-CM | POA: Diagnosis not present

## 2022-07-29 DIAGNOSIS — Z885 Allergy status to narcotic agent status: Secondary | ICD-10-CM

## 2022-07-29 DIAGNOSIS — E876 Hypokalemia: Secondary | ICD-10-CM | POA: Diagnosis not present

## 2022-07-29 DIAGNOSIS — Z87892 Personal history of anaphylaxis: Secondary | ICD-10-CM | POA: Diagnosis not present

## 2022-07-29 DIAGNOSIS — C189 Malignant neoplasm of colon, unspecified: Secondary | ICD-10-CM | POA: Diagnosis not present

## 2022-07-29 DIAGNOSIS — Z9049 Acquired absence of other specified parts of digestive tract: Secondary | ICD-10-CM | POA: Diagnosis not present

## 2022-07-29 DIAGNOSIS — Z9109 Other allergy status, other than to drugs and biological substances: Secondary | ICD-10-CM

## 2022-07-29 DIAGNOSIS — C19 Malignant neoplasm of rectosigmoid junction: Principal | ICD-10-CM | POA: Diagnosis present

## 2022-07-29 DIAGNOSIS — I10 Essential (primary) hypertension: Secondary | ICD-10-CM

## 2022-07-29 DIAGNOSIS — K649 Unspecified hemorrhoids: Secondary | ICD-10-CM | POA: Diagnosis not present

## 2022-07-29 DIAGNOSIS — Z01818 Encounter for other preprocedural examination: Secondary | ICD-10-CM | POA: Diagnosis not present

## 2022-07-29 HISTORY — PX: FLEXIBLE SIGMOIDOSCOPY: SHX5431

## 2022-07-29 HISTORY — PX: XI ROBOTIC ASSISTED LOWER ANTERIOR RESECTION: SHX6558

## 2022-07-29 HISTORY — PX: OSTOMY: SHX5997

## 2022-07-29 LAB — TYPE AND SCREEN
ABO/RH(D): O POS
Antibody Screen: NEGATIVE

## 2022-07-29 SURGERY — RESECTION, RECTUM, LOW ANTERIOR, ROBOT-ASSISTED
Anesthesia: General | Site: Abdomen

## 2022-07-29 MED ORDER — TRAMADOL HCL 50 MG PO TABS: 50.0000 mg | ORAL_TABLET | Freq: Four times a day (QID) | ORAL | 0 refills | Status: AC | PRN

## 2022-07-29 MED ORDER — ONDANSETRON HCL 4 MG/2ML IJ SOLN
4.0000 mg | Freq: Four times a day (QID) | INTRAMUSCULAR | Status: DC | PRN
  Administered 2022-07-30: 4 mg via INTRAVENOUS
  Filled 2022-07-29: qty 2

## 2022-07-29 MED ORDER — LACTATED RINGERS IR SOLN
Status: DC | PRN
  Administered 2022-07-29: 1000 mL

## 2022-07-29 MED ORDER — HEPARIN SODIUM (PORCINE) 5000 UNIT/ML IJ SOLN
5000.0000 [IU] | Freq: Three times a day (TID) | INTRAMUSCULAR | Status: DC
  Administered 2022-07-29 – 2022-07-31 (×5): 5000 [IU] via SUBCUTANEOUS
  Filled 2022-07-29 (×6): qty 1

## 2022-07-29 MED ORDER — LORAZEPAM 0.5 MG PO TABS: 0.5000 mg | ORAL_TABLET | Freq: Four times a day (QID) | ORAL | Status: DC | PRN

## 2022-07-29 MED ORDER — ONDANSETRON HCL 4 MG PO TABS
4.0000 mg | ORAL_TABLET | Freq: Four times a day (QID) | ORAL | Status: DC | PRN
  Administered 2022-07-29: 4 mg via ORAL
  Filled 2022-07-29: qty 1

## 2022-07-29 MED ORDER — AMLODIPINE BESYLATE 5 MG PO TABS
5.0000 mg | ORAL_TABLET | Freq: Every day | ORAL | Status: DC
  Administered 2022-07-29 – 2022-07-30 (×2): 5 mg via ORAL
  Filled 2022-07-29 (×2): qty 1

## 2022-07-29 MED ORDER — ALUM & MAG HYDROXIDE-SIMETH 200-200-20 MG/5ML PO SUSP
30.0000 mL | Freq: Four times a day (QID) | ORAL | Status: DC | PRN
  Administered 2022-07-31: 30 mL via ORAL
  Filled 2022-07-29: qty 30

## 2022-07-29 MED ORDER — PHENYLEPHRINE HCL-NACL 20-0.9 MG/250ML-% IV SOLN
INTRAVENOUS | Status: AC
  Filled 2022-07-29: qty 500

## 2022-07-29 MED ORDER — BUPIVACAINE LIPOSOME 1.3 % IJ SUSP
INTRAMUSCULAR | Status: AC
  Filled 2022-07-29: qty 20

## 2022-07-29 MED ORDER — ACETAMINOPHEN 500 MG PO TABS
1000.0000 mg | ORAL_TABLET | ORAL | Status: AC
  Administered 2022-07-29: 1000 mg via ORAL

## 2022-07-29 MED ORDER — ENSURE SURGERY PO LIQD: 237.0000 mL | Freq: Two times a day (BID) | ORAL | Status: DC

## 2022-07-29 MED ORDER — DIPHENHYDRAMINE HCL 50 MG/ML IJ SOLN: 12.5000 mg | Freq: Four times a day (QID) | INTRAMUSCULAR | Status: DC | PRN

## 2022-07-29 MED ORDER — BUPIVACAINE LIPOSOME 1.3 % IJ SUSP: 20.0000 mL | Freq: Once | INTRAMUSCULAR | Status: DC

## 2022-07-29 MED ORDER — PHENYLEPHRINE 80 MCG/ML (10ML) SYRINGE FOR IV PUSH (FOR BLOOD PRESSURE SUPPORT)
PREFILLED_SYRINGE | INTRAVENOUS | Status: DC | PRN
  Administered 2022-07-29: 80 ug via INTRAVENOUS
  Administered 2022-07-29 (×2): 120 ug via INTRAVENOUS
  Administered 2022-07-29 (×2): 80 ug via INTRAVENOUS

## 2022-07-29 MED ORDER — LOSARTAN POTASSIUM 50 MG PO TABS
100.0000 mg | ORAL_TABLET | Freq: Every day | ORAL | Status: DC
  Administered 2022-07-29 – 2022-07-30 (×2): 100 mg via ORAL
  Filled 2022-07-29 (×3): qty 2

## 2022-07-29 MED ORDER — ROCURONIUM BROMIDE 10 MG/ML (PF) SYRINGE
PREFILLED_SYRINGE | INTRAVENOUS | Status: DC | PRN
  Administered 2022-07-29 (×2): 20 mg via INTRAVENOUS
  Administered 2022-07-29: 60 mg via INTRAVENOUS

## 2022-07-29 MED ORDER — DEXAMETHASONE SODIUM PHOSPHATE 10 MG/ML IJ SOLN
INTRAMUSCULAR | Status: AC
  Filled 2022-07-29: qty 1

## 2022-07-29 MED ORDER — HEPARIN SODIUM (PORCINE) 5000 UNIT/ML IJ SOLN
5000.0000 [IU] | Freq: Once | INTRAMUSCULAR | Status: AC
  Administered 2022-07-29: 5000 [IU] via SUBCUTANEOUS

## 2022-07-29 MED ORDER — ACETAMINOPHEN 500 MG PO TABS
ORAL_TABLET | ORAL | Status: AC
  Filled 2022-07-29: qty 2

## 2022-07-29 MED ORDER — PROPOFOL 10 MG/ML IV BOLUS
INTRAVENOUS | Status: AC
  Filled 2022-07-29: qty 20

## 2022-07-29 MED ORDER — FENTANYL CITRATE PF 50 MCG/ML IJ SOSY
25.0000 ug | PREFILLED_SYRINGE | INTRAMUSCULAR | Status: DC | PRN
  Administered 2022-07-29: 50 ug via INTRAVENOUS

## 2022-07-29 MED ORDER — FENTANYL CITRATE (PF) 100 MCG/2ML IJ SOLN
INTRAMUSCULAR | Status: AC
  Filled 2022-07-29: qty 2

## 2022-07-29 MED ORDER — SUGAMMADEX SODIUM 200 MG/2ML IV SOLN
INTRAVENOUS | Status: DC | PRN
  Administered 2022-07-29: 200 mg via INTRAVENOUS

## 2022-07-29 MED ORDER — TRAMADOL HCL 50 MG PO TABS
50.0000 mg | ORAL_TABLET | Freq: Four times a day (QID) | ORAL | Status: DC | PRN
  Administered 2022-07-29: 50 mg via ORAL
  Filled 2022-07-29: qty 1

## 2022-07-29 MED ORDER — METOPROLOL SUCCINATE ER 50 MG PO TB24
100.0000 mg | ORAL_TABLET | Freq: Every day | ORAL | Status: DC
  Administered 2022-07-29: 100 mg via ORAL
  Filled 2022-07-29: qty 1

## 2022-07-29 MED ORDER — SODIUM CHLORIDE 0.9 % IV SOLN
INTRAVENOUS | Status: AC
  Filled 2022-07-29: qty 2

## 2022-07-29 MED ORDER — LACTATED RINGERS IV SOLN: INTRAVENOUS | Status: DC

## 2022-07-29 MED ORDER — STERILE WATER FOR INJECTION IJ SOLN
INTRAMUSCULAR | Status: DC | PRN
  Administered 2022-07-29: 15 mL via INTRAMUSCULAR

## 2022-07-29 MED ORDER — FENTANYL CITRATE (PF) 100 MCG/2ML IJ SOLN
INTRAMUSCULAR | Status: DC | PRN
  Administered 2022-07-29: 25 ug via INTRAVENOUS
  Administered 2022-07-29: 50 ug via INTRAVENOUS
  Administered 2022-07-29: 12.5 ug via INTRAVENOUS
  Administered 2022-07-29: 50 ug via INTRAVENOUS
  Administered 2022-07-29: 25 ug via INTRAVENOUS
  Administered 2022-07-29: 12.5 ug via INTRAVENOUS
  Administered 2022-07-29: 50 ug via INTRAVENOUS

## 2022-07-29 MED ORDER — SIMETHICONE 80 MG PO CHEW
40.0000 mg | CHEWABLE_TABLET | Freq: Four times a day (QID) | ORAL | Status: DC | PRN
  Administered 2022-07-30: 40 mg via ORAL
  Filled 2022-07-29: qty 1

## 2022-07-29 MED ORDER — ACETAMINOPHEN 500 MG PO TABS
1000.0000 mg | ORAL_TABLET | Freq: Four times a day (QID) | ORAL | Status: DC
  Administered 2022-07-29 – 2022-07-31 (×6): 1000 mg via ORAL
  Filled 2022-07-29 (×6): qty 2

## 2022-07-29 MED ORDER — DEXAMETHASONE SODIUM PHOSPHATE 10 MG/ML IJ SOLN
INTRAMUSCULAR | Status: DC | PRN
  Administered 2022-07-29: 4 mg via INTRAVENOUS

## 2022-07-29 MED ORDER — LACTATED RINGERS IV SOLN: INTRAVENOUS | Status: DC | PRN

## 2022-07-29 MED ORDER — CHLORHEXIDINE GLUCONATE CLOTH 2 % EX PADS: 6.0000 | MEDICATED_PAD | Freq: Once | CUTANEOUS | Status: DC

## 2022-07-29 MED ORDER — ENSURE PRE-SURGERY PO LIQD: 296.0000 mL | Freq: Once | ORAL | Status: DC

## 2022-07-29 MED ORDER — HEPARIN SODIUM (PORCINE) 5000 UNIT/ML IJ SOLN
INTRAMUSCULAR | Status: AC
  Filled 2022-07-29: qty 1

## 2022-07-29 MED ORDER — PHENYLEPHRINE HCL-NACL 20-0.9 MG/250ML-% IV SOLN
INTRAVENOUS | Status: DC | PRN
  Administered 2022-07-29: 35 ug/min via INTRAVENOUS

## 2022-07-29 MED ORDER — METOPROLOL SUCCINATE ER 50 MG PO TB24
100.0000 mg | ORAL_TABLET | Freq: Every day | ORAL | Status: DC
  Administered 2022-07-30: 100 mg via ORAL
  Filled 2022-07-29: qty 2

## 2022-07-29 MED ORDER — LOSARTAN POTASSIUM-HCTZ 100-12.5 MG PO TABS: 1.0000 | ORAL_TABLET | Freq: Every day | ORAL | Status: DC

## 2022-07-29 MED ORDER — 0.9 % SODIUM CHLORIDE (POUR BTL) OPTIME
TOPICAL | Status: DC | PRN
  Administered 2022-07-29: 2000 mL

## 2022-07-29 MED ORDER — LIDOCAINE HCL 2 % IJ SOLN
INTRAMUSCULAR | Status: AC
  Filled 2022-07-29: qty 20

## 2022-07-29 MED ORDER — ALVIMOPAN 12 MG PO CAPS
12.0000 mg | ORAL_CAPSULE | ORAL | Status: AC
  Administered 2022-07-29: 12 mg via ORAL

## 2022-07-29 MED ORDER — SODIUM CHLORIDE (PF) 0.9 % IJ SOLN: INTRAMUSCULAR | Status: DC | PRN

## 2022-07-29 MED ORDER — HYDRALAZINE HCL 20 MG/ML IJ SOLN: 10.0000 mg | INTRAMUSCULAR | Status: DC | PRN

## 2022-07-29 MED ORDER — HYDROCHLOROTHIAZIDE 12.5 MG PO TABS
12.5000 mg | ORAL_TABLET | Freq: Every day | ORAL | Status: DC
  Administered 2022-07-29 – 2022-07-30 (×2): 12.5 mg via ORAL
  Filled 2022-07-29 (×2): qty 1

## 2022-07-29 MED ORDER — ONDANSETRON HCL 4 MG/2ML IJ SOLN
INTRAMUSCULAR | Status: DC | PRN
  Administered 2022-07-29: 4 mg via INTRAVENOUS

## 2022-07-29 MED ORDER — BUPIVACAINE-EPINEPHRINE (PF) 0.5% -1:200000 IJ SOLN
INTRAMUSCULAR | Status: AC
  Filled 2022-07-29: qty 30

## 2022-07-29 MED ORDER — ACETAMINOPHEN 500 MG PO TABS: 1000.0000 mg | ORAL_TABLET | Freq: Once | ORAL | Status: DC

## 2022-07-29 MED ORDER — STERILE WATER FOR INJECTION IJ SOLN
INTRAMUSCULAR | Status: AC
  Filled 2022-07-29: qty 10

## 2022-07-29 MED ORDER — SODIUM CHLORIDE 0.9 % IR SOLN
Status: DC | PRN
  Administered 2022-07-29: 1000 mL

## 2022-07-29 MED ORDER — PROPOFOL 10 MG/ML IV BOLUS
INTRAVENOUS | Status: DC | PRN
  Administered 2022-07-29: 100 mg via INTRAVENOUS

## 2022-07-29 MED ORDER — DIPHENHYDRAMINE HCL 12.5 MG/5ML PO ELIX: 12.5000 mg | ORAL_SOLUTION | Freq: Four times a day (QID) | ORAL | Status: DC | PRN

## 2022-07-29 MED ORDER — LIDOCAINE 2% (20 MG/ML) 5 ML SYRINGE
INTRAMUSCULAR | Status: DC | PRN
  Administered 2022-07-29: 20 mg via INTRAVENOUS

## 2022-07-29 MED ORDER — SODIUM CHLORIDE (PF) 0.9 % IJ SOLN
INTRAMUSCULAR | Status: AC
  Filled 2022-07-29: qty 20

## 2022-07-29 MED ORDER — FENTANYL CITRATE PF 50 MCG/ML IJ SOSY
PREFILLED_SYRINGE | INTRAMUSCULAR | Status: AC
  Filled 2022-07-29: qty 1

## 2022-07-29 MED ORDER — SODIUM CHLORIDE 0.9 % IV SOLN
2.0000 g | INTRAVENOUS | Status: AC
  Administered 2022-07-29: 2 g via INTRAVENOUS

## 2022-07-29 MED ORDER — LIDOCAINE HCL (PF) 2 % IJ SOLN
INTRAMUSCULAR | Status: DC | PRN
  Administered 2022-07-29: 1 mg/kg/h via INTRADERMAL

## 2022-07-29 MED ORDER — ORAL CARE MOUTH RINSE: 15.0000 mL | Freq: Once | OROMUCOSAL | Status: AC

## 2022-07-29 MED ORDER — ALVIMOPAN 12 MG PO CAPS
ORAL_CAPSULE | ORAL | Status: AC
  Filled 2022-07-29: qty 1

## 2022-07-29 MED ORDER — ENSURE PRE-SURGERY PO LIQD: 592.0000 mL | Freq: Once | ORAL | Status: DC

## 2022-07-29 MED ORDER — HYDROMORPHONE HCL 1 MG/ML IJ SOLN: 0.5000 mg | INTRAMUSCULAR | Status: DC | PRN

## 2022-07-29 MED ORDER — ONDANSETRON HCL 4 MG/2ML IJ SOLN
INTRAMUSCULAR | Status: AC
  Filled 2022-07-29: qty 2

## 2022-07-29 MED ORDER — CHLORHEXIDINE GLUCONATE 0.12 % MT SOLN
15.0000 mL | Freq: Once | OROMUCOSAL | Status: AC
  Administered 2022-07-29: 15 mL via OROMUCOSAL

## 2022-07-29 MED ORDER — ALVIMOPAN 12 MG PO CAPS: 12.0000 mg | ORAL_CAPSULE | Freq: Two times a day (BID) | ORAL | Status: DC

## 2022-07-29 MED ORDER — IBUPROFEN 400 MG PO TABS: 600.0000 mg | ORAL_TABLET | Freq: Four times a day (QID) | ORAL | Status: DC | PRN

## 2022-07-29 MED ORDER — ROCURONIUM BROMIDE 10 MG/ML (PF) SYRINGE
PREFILLED_SYRINGE | INTRAVENOUS | Status: AC
  Filled 2022-07-29: qty 10

## 2022-07-29 SURGICAL SUPPLY — 136 items
ADAPTER GOLDBERG URETERAL (ADAPTER) IMPLANT
ADH SKN CLS LQ APL DERMABOND (GAUZE/BANDAGES/DRESSINGS) ×2
ADPR CATH 15X14FR FL DRN BG (ADAPTER)
APL PRP STRL LF DISP 70% ISPRP (MISCELLANEOUS) ×2
APPLIER CLIP 5 13 M/L LIGAMAX5 (MISCELLANEOUS)
APPLIER CLIP ROT 10 11.4 M/L (STAPLE)
APR CLP MED LRG 11.4X10 (STAPLE)
APR CLP MED LRG 5 ANG JAW (MISCELLANEOUS)
BAG COUNTER SPONGE SURGICOUNT (BAG) IMPLANT
BAG SPNG CNTER NS LX DISP (BAG)
BAG URO CATCHER STRL LF (MISCELLANEOUS) ×2 IMPLANT
BLADE EXTENDED COATED 6.5IN (ELECTRODE) ×2 IMPLANT
CANNULA REDUC XI 12-8 STAPL (CANNULA)
CANNULA REDUCER 12-8 DVNC XI (CANNULA) ×2 IMPLANT
CATH URETL OPEN 5X70 (CATHETERS) IMPLANT
CELLS DAT CNTRL 66122 CELL SVR (MISCELLANEOUS) IMPLANT
CHLORAPREP W/TINT 26 (MISCELLANEOUS) ×2 IMPLANT
CLIP APPLIE 5 13 M/L LIGAMAX5 (MISCELLANEOUS) IMPLANT
CLIP APPLIE ROT 10 11.4 M/L (STAPLE) IMPLANT
CLIP LIGATING HEM O LOK PURPLE (MISCELLANEOUS) IMPLANT
CLIP LIGATING HEMO O LOK GREEN (MISCELLANEOUS) IMPLANT
CLOTH BEACON ORANGE TIMEOUT ST (SAFETY) ×2 IMPLANT
COVER SURGICAL LIGHT HANDLE (MISCELLANEOUS) ×4 IMPLANT
COVER TIP SHEARS 8 DVNC (MISCELLANEOUS) ×2 IMPLANT
COVER TIP SHEARS 8MM DA VINCI (MISCELLANEOUS) ×2
DEFOGGER SCOPE WARMER CLEARIFY (MISCELLANEOUS) ×2 IMPLANT
DERMABOND ADVANCED .7 DNX6 (GAUZE/BANDAGES/DRESSINGS) IMPLANT
DEVICE TROCAR PUNCTURE CLOSURE (ENDOMECHANICALS) IMPLANT
DRAIN CHANNEL 19F RND (DRAIN) ×2 IMPLANT
DRAPE ARM DVNC X/XI (DISPOSABLE) ×8 IMPLANT
DRAPE COLUMN DVNC XI (DISPOSABLE) ×2 IMPLANT
DRAPE DA VINCI XI ARM (DISPOSABLE) ×8
DRAPE DA VINCI XI COLUMN (DISPOSABLE) ×2
DRAPE SURG IRRIG POUCH 19X23 (DRAPES) ×2 IMPLANT
DRSG OPSITE POSTOP 4X10 (GAUZE/BANDAGES/DRESSINGS) IMPLANT
DRSG OPSITE POSTOP 4X6 (GAUZE/BANDAGES/DRESSINGS) IMPLANT
DRSG OPSITE POSTOP 4X8 (GAUZE/BANDAGES/DRESSINGS) IMPLANT
DRSG TEGADERM 2-3/8X2-3/4 SM (GAUZE/BANDAGES/DRESSINGS) ×10 IMPLANT
DRSG TEGADERM 4X4.75 (GAUZE/BANDAGES/DRESSINGS) ×2 IMPLANT
ELECT REM PT RETURN 15FT ADLT (MISCELLANEOUS) ×2 IMPLANT
ENDOLOOP SUT PDS II  0 18 (SUTURE)
ENDOLOOP SUT PDS II 0 18 (SUTURE) IMPLANT
EVACUATOR SILICONE 100CC (DRAIN) ×2 IMPLANT
GAUZE SPONGE 2X2 8PLY STRL LF (GAUZE/BANDAGES/DRESSINGS) ×2 IMPLANT
GAUZE SPONGE 4X4 12PLY STRL (GAUZE/BANDAGES/DRESSINGS) IMPLANT
GLOVE BIO SURGEON STRL SZ7.5 (GLOVE) ×6 IMPLANT
GLOVE INDICATOR 8.0 STRL GRN (GLOVE) ×6 IMPLANT
GLOVE SURG LX STRL 7.5 STRW (GLOVE) ×2 IMPLANT
GOWN SRG XL LVL 4 BRTHBL STRL (GOWNS) ×2 IMPLANT
GOWN STRL NON-REIN XL LVL4 (GOWNS)
GOWN STRL REUS W/ TWL XL LVL3 (GOWN DISPOSABLE) ×10 IMPLANT
GOWN STRL REUS W/TWL XL LVL3 (GOWN DISPOSABLE) ×10
GRASPER SUT TROCAR 14GX15 (MISCELLANEOUS) IMPLANT
GUIDEWIRE ANG ZIPWIRE 038X150 (WIRE) IMPLANT
GUIDEWIRE STR DUAL SENSOR (WIRE) IMPLANT
HOLDER FOLEY CATH W/STRAP (MISCELLANEOUS) ×2 IMPLANT
IRRIG SUCT STRYKERFLOW 2 WTIP (MISCELLANEOUS) ×2
IRRIGATION SUCT STRKRFLW 2 WTP (MISCELLANEOUS) ×2 IMPLANT
KIT PROCEDURE DA VINCI SI (MISCELLANEOUS) ×2
KIT PROCEDURE DVNC SI (MISCELLANEOUS) ×2 IMPLANT
KIT TURNOVER KIT A (KITS) IMPLANT
MANIFOLD NEPTUNE II (INSTRUMENTS) ×2 IMPLANT
NDL INSUFFLATION 14GA 120MM (NEEDLE) ×2 IMPLANT
NEEDLE INSUFFLATION 14GA 120MM (NEEDLE) ×2 IMPLANT
PACK CARDIOVASCULAR III (CUSTOM PROCEDURE TRAY) ×2 IMPLANT
PACK COLON (CUSTOM PROCEDURE TRAY) ×2 IMPLANT
PACK CYSTO (CUSTOM PROCEDURE TRAY) ×2 IMPLANT
PAD POSITIONING PINK XL (MISCELLANEOUS) ×2 IMPLANT
PENCIL SMOKE EVACUATOR (MISCELLANEOUS) IMPLANT
PROTECTOR NERVE ULNAR (MISCELLANEOUS) ×4 IMPLANT
RELOAD GRN CONTOUR (ENDOMECHANICALS) ×4 IMPLANT
RELOAD STAPLE 40 GRN THCK (ENDOMECHANICALS) IMPLANT
RELOAD STAPLE 45 3.5 BLU DVNC (STAPLE) IMPLANT
RELOAD STAPLE 45 4.3 GRN DVNC (STAPLE) IMPLANT
RELOAD STAPLE 60 3.5 BLU DVNC (STAPLE) IMPLANT
RELOAD STAPLE 60 4.3 GRN DVNC (STAPLE) IMPLANT
RELOAD STAPLER 3.5X45 BLU DVNC (STAPLE) IMPLANT
RELOAD STAPLER 3.5X60 BLU DVNC (STAPLE) IMPLANT
RELOAD STAPLER 4.3X45 GRN DVNC (STAPLE) IMPLANT
RELOAD STAPLER 4.3X60 GRN DVNC (STAPLE) ×2 IMPLANT
RETRACTOR WND ALEXIS 18 MED (MISCELLANEOUS) IMPLANT
RTRCTR WOUND ALEXIS 18CM MED (MISCELLANEOUS)
SCISSORS LAP 5X35 DISP (ENDOMECHANICALS) IMPLANT
SEAL CANN UNIV 5-8 DVNC XI (MISCELLANEOUS) ×8 IMPLANT
SEAL XI 5MM-8MM UNIVERSAL (MISCELLANEOUS) ×8
SEALER VESSEL DA VINCI XI (MISCELLANEOUS) ×2
SEALER VESSEL EXT DVNC XI (MISCELLANEOUS) ×2 IMPLANT
SLEEVE ADV FIXATION 5X100MM (TROCAR) IMPLANT
SOLUTION ELECTROLUBE (MISCELLANEOUS) ×2 IMPLANT
SPIKE FLUID TRANSFER (MISCELLANEOUS) ×2 IMPLANT
STAPLER CANNULA SEAL DVNC XI (STAPLE) ×2 IMPLANT
STAPLER CANNULA SEAL XI (STAPLE) ×2
STAPLER CVD CUT GN 40 RELOAD (ENDOMECHANICALS) ×2 IMPLANT
STAPLER CVD CUT GRN 40 RELOAD (ENDOMECHANICALS) IMPLANT
STAPLER ECHELON POWER CIR 29 (STAPLE) IMPLANT
STAPLER ECHELON POWER CIR 31 (STAPLE) IMPLANT
STAPLER RELOAD 3.5X45 BLU DVNC (STAPLE)
STAPLER RELOAD 3.5X45 BLUE (STAPLE)
STAPLER RELOAD 3.5X60 BLU DVNC (STAPLE)
STAPLER RELOAD 3.5X60 BLUE (STAPLE)
STAPLER RELOAD 4.3X45 GREEN (STAPLE)
STAPLER RELOAD 4.3X45 GRN DVNC (STAPLE)
STAPLER RELOAD 4.3X60 GREEN (STAPLE) ×2
STAPLER RELOAD 4.3X60 GRN DVNC (STAPLE) ×2
STOPCOCK 4 WAY LG BORE MALE ST (IV SETS) ×4 IMPLANT
SURGILUBE 2OZ TUBE FLIPTOP (MISCELLANEOUS) ×2 IMPLANT
SUT MNCRL AB 4-0 PS2 18 (SUTURE) ×2 IMPLANT
SUT PDS AB 1 CT1 27 (SUTURE) IMPLANT
SUT PDS AB 1 TP1 96 (SUTURE) IMPLANT
SUT PROLENE 0 CT 2 (SUTURE) IMPLANT
SUT PROLENE 2 0 KS (SUTURE) ×2 IMPLANT
SUT PROLENE 2 0 SH DA (SUTURE) IMPLANT
SUT SILK 2 0 (SUTURE)
SUT SILK 2 0 SH CR/8 (SUTURE) IMPLANT
SUT SILK 2-0 18XBRD TIE 12 (SUTURE) IMPLANT
SUT SILK 3 0 (SUTURE) ×2
SUT SILK 3 0 SH CR/8 (SUTURE) ×2 IMPLANT
SUT SILK 3-0 18XBRD TIE 12 (SUTURE) ×2 IMPLANT
SUT V-LOC BARB 180 2/0GR6 GS22 (SUTURE)
SUT VIC AB 3-0 SH 18 (SUTURE) IMPLANT
SUT VIC AB 3-0 SH 27 (SUTURE)
SUT VIC AB 3-0 SH 27XBRD (SUTURE) IMPLANT
SUT VICRYL 0 UR6 27IN ABS (SUTURE) ×2 IMPLANT
SUTURE V-LC BRB 180 2/0GR6GS22 (SUTURE) IMPLANT
SYR 10ML LL (SYRINGE) ×2 IMPLANT
SYS LAPSCP GELPORT 120MM (MISCELLANEOUS)
SYS WOUND ALEXIS 18CM MED (MISCELLANEOUS) ×2
SYSTEM LAPSCP GELPORT 120MM (MISCELLANEOUS) IMPLANT
SYSTEM WOUND ALEXIS 18CM MED (MISCELLANEOUS) ×2 IMPLANT
TAPE UMBILICAL 1/8 X36 TWILL (MISCELLANEOUS) ×2 IMPLANT
TOWEL OR NON WOVEN STRL DISP B (DISPOSABLE) ×2 IMPLANT
TRAY FOLEY MTR SLVR 16FR STAT (SET/KITS/TRAYS/PACK) ×2 IMPLANT
TROCAR ADV FIXATION 5X100MM (TROCAR) ×2 IMPLANT
TUBING CONNECTING 10 (TUBING) ×6 IMPLANT
TUBING INSUFFLATION 10FT LAP (TUBING) ×2 IMPLANT
TUBING UROLOGY SET (TUBING) IMPLANT

## 2022-07-29 NOTE — Consult Note (Signed)
  I was asked to see Tracy Blackwell for firefly instillation.  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  She has no prior GU history.  I reviewed the risks of the cystoscopic procedure including bleeding, infection and ureteral injury.     She is willing to proceed.

## 2022-07-29 NOTE — Op Note (Signed)
PATIENT: Tracy Blackwell  88 y.o. female  Patient Care Team: Maury Dus, MD as PCP - General (Family Medicine) Ileana Roup, MD as Consulting Physician (General Surgery) Ronnette Juniper, MD as Consulting Physician (Gastroenterology) Algie Coffer Mindi Slicker, RN as Oncology Nurse Navigator  PREOP DIAGNOSIS: COLON CANCER  POSTOP DIAGNOSIS: COLON CANCER  PROCEDURE:  Robotic assisted low anterior resection Flexible sigmoidoscopy Bilateral transversus abdominus plane (TAP) blocks  [facility] - cystoscopy/ureteral indocyanine green  SURGEON: Sharon Mt. Maykel Reitter, MD  ASSISTANT: Leighton Ruff, MD  ANESTHESIA: General endotracheal  EBL: 50 mL Total I/O In: 800 [I.V.:700; IV Piggyback:100] Out: 250 [Urine:200; Blood:50]  DRAINS: None  SPECIMEN: Rectosigmoid colon - stitch on distal staple line Distal anastomotic donut - 'final distal margin' Proximal anastomotic donut  COUNTS: Sponge, needle and instrument counts were reported correct x2  FINDINGS: Mass occupying the proximal site of the prior anastomosis.  Normal peritoneal surfaces.  Normal surface of liver.  A low anterior resection was carried out.  A well perfused, tension free, hemostatic, air tight 29 mm EEA colorectal anastomosis fashioned 10 cm from the anal verge by flexible sigmoidoscopy.   NARRATIVE: Informed consent was verified. The patient was taken to the operating room, placed supine on the operating table and SCD's were applied. General endotracheal anesthesia was induced without difficulty. She was then positioned in the lithotomy position with Allen stirrups.  Pressure points were evaluated and padded.  A foley catheter was then placed by nursing under sterile conditions. Hair on the abdomen was clipped.  She was secured to the operating table. Dr. Jeffie Pollock with [facility] scrubbed for his portion of the procedure.  Please refer to his notes for details regarding this portion of the operation. The  abdomen was then prepped and draped in the standard sterile fashion. Surgical timeout was called indicating the correct patient, procedure, positioning and need for preoperative antibiotics.   An OG tube was placed by anesthesia and confirmed to be to suction.  At Palmer's point, a stab incision was created and the Veress needle was introduced into the peritoneal cavity on the first attempt.  Intraperitoneal location was confirmed by the aspiration and saline drop test.  Pneumoperitoneum was established to a maximum pressure of 15 mmHg using CO2.  Following this, the abdomen was marked for planned trocar sites.  Just to the right and cephalad to the umbilicus, an 8 mm incision was created and an 8 mm blunt tipped robotic trocar was cautiously placed into the peritoneal cavity.  The laparoscope was inserted and demonstrated no evidence of trocar site nor Veress needle site complications.  The Veress needle was removed.  Bilateral transversus abdominis plane blocks were then created using a dilute mixture of Exparel with Marcaine.  3 additional 8 mm robotic trochars were placed under direct visualization roughly in a line extending from the right ASIS towards the left upper quadrant. The bladder was inspected and noted to be at/below the pubic symphysis.  Staying 3 fingerbreadths above the pubic symphysis, an incision was created and the 12 mm robotic trocar inserted directed cephalad into the peritoneal cavity under direct visualization.  An additional 5 mm assist port was placed in the right lateral abdomen under direct visualization.  The abdomen was surveyed and there was no evident mass at the rectosigmoid junction.  The peritoneal surfaces are normal in appearance. She was positioned in Trendelenburg with the left side tilted slightly up.  Small bowel was carefully retracted out of the pelvis.  The robot  was then docked and I went to the console.  The rectosigmoid colon is adherent in the pelvis from prior  surgery but the mass appears well circumscribed. We began with a lateral to medial type approach.  The remaining sigmoid colon was mobilized off the underlying retroperitoneum by incising the Natilie Krabbenhoft line of [place]t at the junction of the descending colon and continue this into the pelvis.  The gonadal's and left ureter are identified and protected.  The mesocolon was mobilized off of this.  We also then mobilized the entire descending colon up to the level of the splenic flexure.  The associated mesocolon was reflected medially.  In doing this, we had more than sufficient reach of her remaining sigmoid and even descending colon to reach into the pelvis without any difficulty.  The mass is then approached.  We mobilized laterally first again confirming location of the left ureter during this portion of the dissection.  We then mobilized the anterior attachments.  We then approached the mass from the right side and were able to get into the TME plane between the presacral fascia and fascia propria the rectum.  We worked distally first into the deep pelvis and circumferentially.  The mass was then approached and we continued this plan dissection and doing so were able to fully mobilize this.  There was no evident tumor involvement of the surrounding structures.  The left ureter was also identified from this approach and protected.  The rectum was rather large with a redundant mesorectum.  We opted to complete this portion of the dissection through a Pfannenstiel extraction site.  We confirmed adequate reach of the planned proximal point of division and took the involved mesentery out to this location using the vessel sealer.  The colon is well-perfused in appearance going out to the clear point of transection with visible pulsation in the mesentery.   This colon is also supple and healthy in appearance without any thickening.  This reached into the pelvis without any difficulty and remained in that location without  any tension.  Attention was turned to the extracorporeal portion of the procedure.  The robot was undocked.  I scrubbed back in.  A Pfannenstiel incision was created 2 fingerbreadths above the pubic symphysis, lower than her prior incision given her habitus and the plan for completion of this case through her Pfannenstiel.  The rectus fascia was incised and then elevated.  The rectus muscle was mobilized free of the overlying fascia.  The peritoneum was incised in the midline well above the location of the bladder.  An [place] wound protector was placed.  Towels were placed around the field.  We are able to identify the proximal point of planned transection and this is then divided using a green load of the contour stapler at the location of the mesentery been cleared.  We then directed our attention of the distal point of planned transection which is a few centimeters below the mass.  This is in the pelvis approximately at the location of the mid rectum.  We completed the mesorectal clearing using a right angle and Bovie.  Were able to dissect out the rectum circumferentially at this level doing a tumor specific total mesorectal excision.  The rectum was then divided with a green load contour stapler.  The rectal staple line is inspected and noted to be intact with well-formed staples.  The specimen is passed off after orienting with a stitch on the distal side.  The point of proximal  division was identified and was again on a healthy segment of supple colon with a palpable pulse in the mesentery. This was pink in color.  A pursestring device was applied.  A 2-0 Prolene on a Keith needle was passed.  The prior staple line was divided and discarded.  EEA sizers were then introduced and a 29 mm EEA selected.  "Belt loops" consisting of 3-0 silk were placed around the pursestring suture line.  The anvil was placed and the pursestring tied.  A small amount of fat was cleared from the planned anastomosis and no  diverticula were apparent within this.  This was placed back into the abdomen and easily reached into the pelvis without any difficulty and remained in that location.  I then went below to pass the stapler.  My partner remained above.  EEA sizers were cautiously introduced via the anus and advanced under direct visualization.  She had an indwelling pessary that was removed as it was interfering with her visualization of the rectal staple line.  The stapler was passed and the spike deployed just anterior to the staple line.  The components were then mated.  Orientation was confirmed such that there is no twisting of the colon nor small bowel underneath the mesenteric defect. Care was taken to ensure no other structures were incorporated within this either.  The stapler was then closed, held, and fired. This was then removed. The donuts were inspected and noted to be complete and separately passed off.  The colon proximal to the anastomosis was then gently occluded. The pelvis was filled with sterile irrigation. Under direct visualization, I passed a flexible sigmoidoscope.  The anastomosis was under water.  With good distention of the anastomosis there was no air leak. The anastomosis pink in appearance.  This is located at 10 cm from the anal verge by flexible sigmoidoscopy.  It is hemostatic.  Additionally, looking from above, there is no tension on the colon or mesentery.  Sigmoidoscope was withdrawn.  Irrigation was evacuated from the pelvis.  The abdomen and pelvis are surveyed and noted to be completely hemostatic without any apparent injury.  Under direct visualization, all trochars are removed.  The [place] wound protector was removed.  Gowns/gloves are changed and a fresh set of clean instruments utilized. Additional sterile drapes were placed around the field.   The Pfannenstiel peritoneum was closed with a running 0 Vicryl suture.  The rectus fascia was then closed using 2 running #1 PDS sutures.  The  fascia was then palpated and noted to be completely closed.  Additional anesthetic was infiltrated at the Pfannenstiel site.  Sponge, needle, and instrument counts were reported correct x2. 4-0 Monocryl subcuticular suture was used to close the skin of all incision sites.  Dermabond was placed over all incisions.  A honeycomb dressing placed over the Pfannenstiel as well.   She was then taken out of lithotomy, awakened from anesthesia, extubated, and transferred to a stretcher for transport to PACU in satisfactory condition having tolerated the procedure well.

## 2022-07-29 NOTE — Anesthesia Postprocedure Evaluation (Addendum)
Anesthesia Post Note  Patient: Tracy Blackwell  Procedure(s) Performed: XI ROBOTIC ASSISTED REDO LOWER ANTERIOR RESECTION (Abdomen) FLEXIBLE SIGMOIDOSCOPY POSSIBLE OSTOMY CYSTOSCOPY with FIREFLY INJECTION     Patient location during evaluation: PACU Anesthesia Type: General Level of consciousness: awake and alert, oriented and patient cooperative Pain management: pain level controlled Vital Signs Assessment: post-procedure vital signs reviewed and stable Respiratory status: spontaneous breathing, nonlabored ventilation, respiratory function stable and patient connected to nasal cannula oxygen Cardiovascular status: blood pressure returned to baseline and stable Postop Assessment: no apparent nausea or vomiting Anesthetic complications: no   No notable events documented.  Last Vitals:  Vitals:   07/29/22 1400 07/29/22 1405  BP: 126/64 121/60  Pulse: 61 (!) 59  Resp: 18 19  Temp:  (!) 35.8 C  SpO2: 100% 100%    Last Pain:  Vitals:   07/29/22 1300  TempSrc:   PainSc: Asleep                 Despina Boan,E. Kebrina Friend

## 2022-07-29 NOTE — Anesthesia Procedure Notes (Signed)
Procedure Name: Intubation Date/Time: 07/29/2022 7:42 AM  Performed by: West Pugh, CRNAPre-anesthesia Checklist: Patient identified, Emergency Drugs available, Suction available, Patient being monitored and Timeout performed Patient Re-evaluated:Patient Re-evaluated prior to induction Oxygen Delivery Method: Circle system utilized Preoxygenation: Pre-oxygenation with 100% oxygen Induction Type: IV induction Ventilation: Mask ventilation without difficulty Laryngoscope Size: Mac and 3 Grade View: Grade I Tube type: Oral Tube size: 7.0 mm Number of attempts: 1 Airway Equipment and Method: Stylet Placement Confirmation: ETT inserted through vocal cords under direct vision, positive ETCO2, CO2 detector and breath sounds checked- equal and bilateral Secured at: 21 cm Tube secured with: Tape Dental Injury: Teeth and Oropharynx as per pre-operative assessment

## 2022-07-29 NOTE — Consult Note (Addendum)
WOC consult requested for presurgical stoma site marking.  This was already performed on 12/8; please refer to that consult for measurements.  [facility] team will follow after surgery for teaching sessions if patient receives an ostomy.  Please re-consult if further assistance is needed.  Thank-you,  Julien Girt MSN, [place], [place], [place], [place]

## 2022-07-29 NOTE — Transfer of Care (Signed)
Immediate Anesthesia Transfer of Care Note  Patient: Tracy Blackwell  Procedure(s) Performed: XI ROBOTIC ASSISTED REDO LOWER ANTERIOR RESECTION (Abdomen) FLEXIBLE SIGMOIDOSCOPY POSSIBLE OSTOMY CYSTOSCOPY with FIREFLY INJECTION  Patient Location: PACU  Anesthesia Type:General  Level of Consciousness: awake and patient cooperative  Airway & Oxygen Therapy: Patient Spontanous Breathing and Patient connected to face mask oxygen  Post-op Assessment: Report given to RN and Post -op Vital signs reviewed and stable  Post vital signs: Reviewed and stable  Last Vitals:  Vitals Value Taken Time  BP 153/80 07/29/22 1200  Temp    Pulse 72 07/29/22 1201  Resp 17 07/29/22 1201  SpO2 100 % 07/29/22 1201  Vitals shown include unvalidated device data.  Last Pain:  Vitals:   07/29/22 0640  TempSrc: Oral  PainSc:          Complications: No notable events documented.

## 2022-07-29 NOTE — Op Note (Signed)
Procedure: Cystoscopy with bilateral ureteral catheterization with instillation of firefly.  Preop diagnosis: Recurrent colon cancer.  Postop diagnosis: Same.  Surgeon: Dr. Irine Seal.  Anesthesia: General.  Specimen: None.  Drains: 52 French Foley catheter.  EBL: None.  Complications: None.  Indications: The patient is an 88 year old female with a history of colon cancer with a recurrence that is to be managed surgically by Dr. Dema Severin and he has requested instillation of firefly to a ureteral visualization.  Procedure: She was taken the operating room where a general anesthetic was induced.  She was given cefotetan.  She was placed in lithotomy position and fitted with PAS hose.  Her perineum and genitalia were prepped with Betadine solution she was draped in usual sterile fashion.  Cystoscopy was performed using the 21 [place] scope and 30 degree lens.  Examination revealed a normal urethra.  The bladder wall had moderate to severe trabeculation with some mild erythema and findings suggestive of follicular cystitis although scant.  No tumors or stones were noted.  Ureteral orifices were in the normal anatomic position.  The right ureteral orifice was cannulated with a 5 [place] open-ended catheter and 7.5 mL of firefly solution was gently instilled.  The left ureteral orifice was cannulated with a 5 [place] open-ended catheter and 7.5 mL of firefly solution was gently instilled.  The cystoscope was removed and a 16 French Foley catheter was inserted without difficulty.  The balloon was filled with 10 mL of sterile fluid and the cath was placed straight drainage.  Of note during catheter placement she was noted to have a pessary in place and I informed Dr. Dema Severin of that.  The procedure was then turned over to the general surgery team.  There were no complications.

## 2022-07-29 NOTE — Discharge Instructions (Signed)
POST OP INSTRUCTIONS AFTER COLON SURGERY  DIET: Be sure to include lots of fluids daily to stay hydrated - 64oz of water per day (8, 8 oz glasses).  Avoid fast food or heavy meals for the first couple of weeks as your are more likely to get nauseated. Avoid raw/uncooked fruits or vegetables for the first 4 weeks (its ok to have these if they are blended into smoothie form). If you have fruits/vegetables, make sure they are cooked until soft enough to mash on the roof of your mouth and chew your food well. Otherwise, diet as tolerated.  Take your usually prescribed home medications unless otherwise directed.  PAIN CONTROL: Pain is best controlled by a usual combination of three different methods TOGETHER: Ice/Heat Over the counter pain medication Prescription pain medication Most patients will experience some swelling and bruising around the surgical site.  Ice packs or heating pads (30-60 minutes up to 6 times a day) will help. Some people prefer to use ice alone, heat alone, alternating between ice & heat.  Experiment to what works for you.  Swelling and bruising can take several weeks to resolve.   It is helpful to take an over-the-counter pain medication regularly for the first few weeks: Ibuprofen (Motrin/Advil) - '200mg'$  tabs - take 3 tabs ('600mg'$ ) every 6 hours as needed for pain (unless you have been directed previously to avoid NSAIDs/ibuprofen) Acetaminophen (Tylenol) - you may take '650mg'$  every 6 hours as needed. You can take this with motrin as they act differently on the body. If you are taking a narcotic pain medication that has acetaminophen in it, do not take over the counter tylenol at the same time. NOTE: You may take both of these medications together - most patients  find it most helpful when alternating between the two (i.e. Ibuprofen at 6am, tylenol at 9am, ibuprofen at 12pm ..Marland Kitchen) A  prescription for pain medication should be given to you upon discharge.  Take your pain medication as  prescribed if your pain is not adequatly controlled with the over-the-counter pain reliefs mentioned above.  Avoid getting constipated.  Between the surgery and the pain medications, it is common to experience some constipation.  Increasing fluid intake and taking a fiber supplement (such as Metamucil, Citrucel, FiberCon, MiraLax, etc) 1-2 times a day regularly will usually help prevent this problem from occurring.  A mild laxative (prune juice, Milk of Magnesia, MiraLax, etc) should be taken according to package directions if there are no bowel movements after 48 hours.    Dressing: Your incisions are covered in Dermabond which is like sterile superglue for the skin. This will come off on it's own in a couple weeks. It is waterproof and you may bathe normally starting the day after your surgery in a shower. Avoid baths/pools/lakes/oceans until your wounds have fully healed.  ACTIVITIES as tolerated:   Avoid heavy lifting (>10lbs or 1 gallon of milk) for the next 6 weeks. You may resume regular daily activities as tolerated--such as daily self-care, walking, climbing stairs--gradually increasing activities as tolerated.  If you can walk 30 minutes without difficulty, it is safe to try more intense activity such as jogging, treadmill, bicycling, low-impact aerobics.  DO NOT PUSH THROUGH PAIN.  Let pain be your guide: If it hurts to do something, don't do it. You may drive when you are no longer taking prescription pain medication, you can comfortably wear a seatbelt, and you can safely maneuver your car and apply brakes. Leave the pessary ring out  for the next 8 weeks if at all possible  FOLLOW UP in our office Please call [facility] at [phone] to set up an appointment to see your surgeon in the office for a follow-up appointment approximately 2 weeks after your surgery. Make sure that you call for this appointment the day you arrive home to insure a convenient appointment time.  9. If you have  disability or family leave forms that need to be completed, you may have them completed by your primary care physician's office; for return to work instructions, please ask our office staff and they will be happy to assist you in obtaining this documentation   When to call us [phone]: Poor pain control Reactions / problems with new medications (rash/itching, etc)  Fever over 101.5 F (38.5 C) Inability to urinate Nausea/vomiting Worsening swelling or bruising Continued bleeding from incision. Increased pain, redness, or drainage from the incision  The clinic staff is available to answer your questions during regular business hours (8:30am-5pm).  Please don't hesitate to call and ask to speak to one of our nurses for clinical concerns.   A surgeon from [facility] is always on call at the hospitals   If you have a medical emergency, go to the nearest emergency room or call 911.  [facility], [place] [address], [place], [place], [place]  [address] MAIN: [phone] FAX: [phone] [email or web address]

## 2022-07-29 NOTE — H&P (Signed)
CC: here today for surgery  HPI: Tracy Blackwell is an 88 y.o. female with history of HTN, colon cancer, whom is seen in the office today as a referral by Dr. Benay Spice for evaluation of colorectal cancer.  She is known to our practice. She has a history of sigmoid colon cancer. She underwent laparoscopic-assisted low anterior resection 08/14/2017 by myself.  She had previously undergone staging CT scans which demonstrated a partially obstructive lesion at the rectosigmoid colon. There is no distant metastatic disease that was evident.  Previously, colonoscopy with Dr. Deno Etienne had showed a large villous-like lesion in the rectosigmoid colon at 25 cm. This was biopsied and found to be invasive adenocarcinoma.  08/14/17: Laparoscopic LAR -  [facility] - invasive adenocarcinoma, 5 cm; pT3N0 (0/22) M0. Margins negative.   She recovered uneventfully and was discharged home 08/16/2017. She recovered well and is seen in follow-up. She also met with Dr. Benay Spice with medical oncology. Further surveillance was advised. She followed back up with medical oncology 03/06/2018. Completion colonoscopy was recommended.   She was subsequent lost to follow-up.  CT chest/abdomen/pelvis 06/08/2022 Dr. Benay Spice showed: 1. Signs of previous distal colonic resection with colorectal anastomotic suture line within the left hemipelvis. There is wall thickening involving the 5 cm of bowel proximal suture chain as well as the entirety of the rectum. Mild soft tissue stranding is noted within the adjacent mesocolon. Additionally, small perirectal lymph nodes are noted within the pelvis measuring up to 8 mm. Underlying locally recurrent tumor would be difficult to exclude. Consider further evaluation with direct visualization. 2. No signs of distant metastatic disease within the chest, abdomen or pelvis. 3. Bilateral nonobstructing renal calculi. 4. Right upper quadrant abdominal wall hernia contains fat only. 5. Multiple  fluid attenuating cysts are identified bilaterally compatible with benign Bosniak class I and II lesions. No follow-up imaging recommended. 6. Aortic Atherosclerosis (ICD10-I70.0).  Flex sig completed with Dr. Therisa Doyne 05/30/2022-fax report indicates: 1. Hemorrhoids 2. Fungating infiltrative polypoid villous lichen sessile partially obstructing mass at approximately 10 cm from the anus. Circumferential. Biopsied.  She reports that the flex sig was completed for screening/surveillance purposes. She denies any symptoms preceding any of this or since. No abdominal pain. No nausea, no vomiting. She denies any blood in her stool or weight changes. She otherwise feels great. She is here today with her daughter.  Pelvic MRI 06/13/22 - 1. Status post low anterior resection with rectosigmoid anastomosis. Confirmation of local recurrent surrounding the surgical site. 2. Direct tumor extension into the sigmoid mesocolon with suspicious regional adenopathy.  Her case was presented at our multidisciplinary tumor board and the consensus recommendation was for upfront surgery if we are going to go down the route of curative intent. She denies any complaints at present. She did have what sounds like a bout of food poisoning that her son/grandson also experienced following some bad salami. This was a couple weeks ago. Now having regular bowel movements. No diarrhea or nausea/vomiting. No abdominal pain.   She denies any changes in her health or health history since we met in the office. States she is ready for surgery.   PMH: HTN  PSH: Laparoscopic LAR 2018  FHx: Denies any known family history of colorectal, breast, endometrial or ovarian cancer  Social Hx: Denies use of tobacco/illicit drug; social EtOH use; here today with her daughter   Past Medical History:  Diagnosis Date   Cancer [facility])    rectosigmoid cancer   Hypertension  Pneumonia    age 63 months old    Past Surgical History:   Procedure Laterality Date   CATARACT EXTRACTION, BILATERAL     COLONOSCOPY     CYSTOSCOPY WITH STENT PLACEMENT Bilateral 08/14/2017   Procedure: CYSTOSCOPY WITH BILATERAL URETERAL CATHETER PLACEMENT;  Surgeon: Ceasar Mons, MD;  Location: [facility];  Service: Urology;  Laterality: Bilateral;   FLEXIBLE SIGMOIDOSCOPY N/A 08/14/2017   Procedure: FLEXIBLE SIGMOIDOSCOPY;  Surgeon: Ileana Roup, MD;  Location: [facility];  Service: General;  Laterality: N/A;   LAPAROSCOPIC LOW ANTERIOR RESECTION N/A 08/14/2017   Procedure: LAPAROSCOPIC ASSISTED ANTERIOR RESECTION;  Surgeon: Ileana Roup, MD;  Location: [facility];  Service: General;  Laterality: N/A;  ERAS PATHWAY   UMBILICAL HERNIA REPAIR N/A 08/14/2017   Procedure: UMBILICAL HERNIA REPAIR;  Surgeon: Ileana Roup, MD;  Location: [facility];  Service: General;  Laterality: N/A;    History reviewed. No pertinent family history.  Social:  reports that she has never smoked. She has never used smokeless tobacco. She reports current alcohol use. She reports that she does not use drugs.  Allergies:  Allergies  Allergen Reactions   Wasp Venom Anaphylaxis   Codeine Nausea And Vomiting    Medications: I have reviewed the patient's current medications.  No results found for this or any previous visit (from the past 48 hour(s)).  No results found.  ROS - all of the below systems have been reviewed with the patient and positives are indicated with bold text General: chills, fever or night sweats Eyes: blurry vision or double vision ENT: epistaxis or sore throat Allergy/Immunology: itchy/watery eyes or nasal congestion Hematologic/Lymphatic: bleeding problems, blood clots or swollen lymph nodes Endocrine: temperature intolerance or unexpected weight changes Breast: new or changing breast lumps or nipple discharge Resp: cough, shortness of breath, or wheezing CV: chest pain or dyspnea on exertion GI: as per HPI GU:  dysuria, trouble voiding, or hematuria MSK: joint pain or joint stiffness Neuro: TIA or stroke symptoms Derm: pruritus and skin lesion changes Psych: anxiety and depression  PE Blood pressure (!) 166/83, pulse 70, temperature 98.6 F (37 C), temperature source Oral, resp. rate 16, height _0  (1.575 m), weight 68 kg, SpO2 98 %. Constitutional: NAD; conversant Eyes: Moist conjunctiva Lungs: Normal respiratory effort CV: RRR GI: Abd soft, NT/ND MSK: Normal range of motion of extremities Psychiatric: Appropriate affect; alert and oriented x3  No results found for this or any previous visit (from the past 48 hour(s)).  No results found.  A/P: Tracy Blackwell is an 88 y.o. female with hx of HTN, sigmoid colon here for evaluation of now evident distal sigmoid cancer found on flex sig 05/2022 -   Restaging CT chest/abdomen/pelvis 06/08/2022 demonstrates no evidence of metastatic disease.  -Pelvic MRI 06/13/22 -mass appears to extend proximal to the anastomosis and not distal to it. This therefore extends up into the sigmoid mesocolon with some suspicious regional adenopathy.  -Case was discussed at our multidisciplinary tumor board and the consensus recommendation was for surgery first approach if in line with her goals. She is motivated to pursue curative intent treatment and surgery.  -Medical clearance  -The anatomy and physiology of the GI tract was reviewed with the patient. The pathophysiology of colorectal cancer was discussed as well with associated pictures. -We have discussed various different treatment options going forward including surgery (the most definitive) to address this -robotic assisted low anterior resection, flexible sigmoidoscopy, scenarios where an ostomy could be necessary -  The planned procedures, material risks (including, but not limited to, pain, bleeding, infection, scarring, need for blood transfusion, damage to surrounding structures- blood  vessels/nerves/viscus/organs, damage to ureter, urine leak, leak from anastomosis, need for additional procedures, scenarios where a stoma may be necessary and where it may be permanent, worsening of pre-existing medical conditions, hernia, recurrence, pneumonia, heart attack, stroke, death) benefits and alternatives to surgery were discussed at length. Additionally, we discussed typical postoperative expectations and the recovery process.  -All of her and her daughter's questions were answered to their satisfaction, they expressed understanding, and agreed with the plan.   Nadeen Landau, [facility] [place]

## 2022-07-30 ENCOUNTER — Encounter (HOSPITAL_COMMUNITY): Payer: Self-pay | Admitting: Surgery

## 2022-07-30 LAB — CBC
HCT: 31 % — ABNORMAL LOW (ref 36.0–46.0)
Hemoglobin: 9.9 g/dL — ABNORMAL LOW (ref 12.0–15.0)
MCH: 28 pg (ref 26.0–34.0)
MCHC: 31.9 g/dL (ref 30.0–36.0)
MCV: 87.6 fL (ref 80.0–100.0)
Platelets: 271 10*3/uL (ref 150–400)
RBC: 3.54 MIL/uL — ABNORMAL LOW (ref 3.87–5.11)
RDW: 15.9 % — ABNORMAL HIGH (ref 11.5–15.5)
WBC: 13.4 10*3/uL — ABNORMAL HIGH (ref 4.0–10.5)
nRBC: 0 % (ref 0.0–0.2)

## 2022-07-30 LAB — BASIC METABOLIC PANEL
Anion gap: 9 (ref 5–15)
Anion gap: 11 (ref 5–15)
BUN: 11 mg/dL (ref 8–23)
BUN: 13 mg/dL (ref 8–23)
CO2: 23 mmol/L (ref 22–32)
CO2: 22 mmol/L (ref 22–32)
Calcium: 8.2 mg/dL — ABNORMAL LOW (ref 8.9–10.3)
Calcium: 8.7 mg/dL — ABNORMAL LOW (ref 8.9–10.3)
Chloride: 104 mmol/L (ref 98–111)
Chloride: 102 mmol/L (ref 98–111)
Creatinine, Ser: 0.99 mg/dL (ref 0.44–1.00)
Creatinine, Ser: 1.26 mg/dL — ABNORMAL HIGH (ref 0.44–1.00)
GFR, Estimated: 56 mL/min — ABNORMAL LOW (ref >60)
GFR, Estimated: 42 mL/min — ABNORMAL LOW (ref >60)
Glucose, Bld: 96 mg/dL (ref 70–99)
Glucose, Bld: 120 mg/dL — ABNORMAL HIGH (ref 70–99)
Potassium: 2.3 mmol/L — CL (ref 3.5–5.1)
Potassium: 2.6 mmol/L — CL (ref 3.5–5.1)
Sodium: 136 mmol/L (ref 135–145)
Sodium: 135 mmol/L (ref 135–145)

## 2022-07-30 MED ORDER — POTASSIUM CHLORIDE CRYS ER 20 MEQ PO TBCR
40.0000 meq | EXTENDED_RELEASE_TABLET | Freq: Once | ORAL | Status: AC
  Administered 2022-07-30: 40 meq via ORAL
  Filled 2022-07-30: qty 2

## 2022-07-30 MED ORDER — POTASSIUM CHLORIDE CRYS ER 20 MEQ PO TBCR
20.0000 meq | EXTENDED_RELEASE_TABLET | Freq: Two times a day (BID) | ORAL | Status: DC
  Administered 2022-07-30 (×2): 20 meq via ORAL
  Filled 2022-07-30 (×2): qty 1

## 2022-07-30 MED ORDER — POTASSIUM CHLORIDE 10 MEQ/100ML IV SOLN
10.0000 meq | INTRAVENOUS | Status: AC
  Administered 2022-07-30 (×3): 10 meq via INTRAVENOUS
  Filled 2022-07-30 (×3): qty 100

## 2022-07-30 NOTE — Progress Notes (Signed)
Lab called another critical K+ of 2.6. Dr Marlou Starks in [place]... pt receiving IV potassium therefore treatment of low potassium not complete.

## 2022-07-30 NOTE — Progress Notes (Signed)
1 Day Post-Op   Subjective/Chief Complaint: No complaints. Wants to go home   Objective: Vital signs in last 24 hours: Temp:  [95.5 F (35.3 C)-98.1 F (36.7 C)] 98.1 F (36.7 C) (12/16 0521) Pulse Rate:  [52-73] 62 (12/16 0521) Resp:  [11-21] 18 (12/16 0521) BP: (100-153)/(46-86) 100/58 (12/16 0521) SpO2:  [90 %-100 %] 95 % (12/16 0521) Last BM Date : 07/29/22  Intake/Output from previous day: 12/15 0701 - 12/16 0700 In: 3925.5 [P.O.:840; I.V.:2985.5; IV Piggyback:100] Out: 1000 [Urine:950; Blood:50] Intake/Output this shift: No intake/output data recorded.  General appearance: alert and cooperative Resp: clear to auscultation bilaterally Cardio: regular rate and rhythm GI: soft, minimal tenderness. Incisions look good  Lab Results:  Recent Labs    07/30/22 0539  WBC 13.4*  HGB 9.9*  HCT 31.0*  PLT 271   BMET Recent Labs    07/30/22 0539  NA 136  K 2.3*  CL 104  CO2 23  GLUCOSE 96  BUN 11  CREATININE 0.99  CALCIUM 8.2*   PT/INR No results for input(s): "LABPROT", "INR" in the last 72 hours. ABG No results for input(s): "PHART", "HCO3" in the last 72 hours.  Invalid input(s): "PCO2", "PO2"  Studies/Results: No results found.  Anti-infectives: Anti-infectives (From admission, onward)    Start     Dose/Rate Route Frequency Ordered Stop   07/29/22 0600  cefoTEtan (CEFOTAN) 2 g in sodium chloride 0.9 % 100 mL IVPB        2 g 200 mL/hr over 30 Minutes Intravenous On call to O.R. 07/29/22 0534 07/29/22 0813   07/29/22 0540  sodium chloride 0.9 % with cefoTEtan (CEFOTAN) ADS Med       Note to Pharmacy: Guadelupe Sabin B: cabinet override      07/29/22 0540 07/29/22 0755       Assessment/Plan: s/p Procedure(s): XI ROBOTIC ASSISTED REDO LOWER ANTERIOR RESECTION (N/A) FLEXIBLE SIGMOIDOSCOPY (N/A) POSSIBLE OSTOMY (N/A) CYSTOSCOPY with FIREFLY INJECTION (N/A) Advance diet Replace K and recheck around midday. If improved then will plan for d/c later  today Ambulate POD 1 LAR  LOS: 1 day    Autumn Messing III 07/30/2022

## 2022-07-30 NOTE — Progress Notes (Signed)
Critical lab called from laboratory, K+ 2.3. Reported to Dr. Marlou Starks on am rounds to the unit. See new orders entered into EPIC.

## 2022-07-30 NOTE — Progress Notes (Signed)
Dr Marlou Starks aware of last K+ level 2.6 and loose stools. MD decided no dc today & to keep pt tonight... MD said to give another dose of potassium po and BMET in am, entered per verbal order.

## 2022-07-31 LAB — BASIC METABOLIC PANEL
Anion gap: 8 (ref 5–15)
BUN: 14 mg/dL (ref 8–23)
CO2: 25 mmol/L (ref 22–32)
Calcium: 8.8 mg/dL — ABNORMAL LOW (ref 8.9–10.3)
Chloride: 102 mmol/L (ref 98–111)
Creatinine, Ser: 0.9 mg/dL (ref 0.44–1.00)
GFR, Estimated: >60 mL/min (ref >60)
Glucose, Bld: 103 mg/dL — ABNORMAL HIGH (ref 70–99)
Potassium: 3.4 mmol/L — ABNORMAL LOW (ref 3.5–5.1)
Sodium: 135 mmol/L (ref 135–145)

## 2022-07-31 LAB — CBC
HCT: 34 % — ABNORMAL LOW (ref 36.0–46.0)
Hemoglobin: 10.9 g/dL — ABNORMAL LOW (ref 12.0–15.0)
MCH: 28.2 pg (ref 26.0–34.0)
MCHC: 32.1 g/dL (ref 30.0–36.0)
MCV: 87.9 fL (ref 80.0–100.0)
Platelets: 296 10*3/uL (ref 150–400)
RBC: 3.87 MIL/uL (ref 3.87–5.11)
RDW: 16.4 % — ABNORMAL HIGH (ref 11.5–15.5)
WBC: 14.9 10*3/uL — ABNORMAL HIGH (ref 4.0–10.5)
nRBC: 0 % (ref 0.0–0.2)

## 2022-07-31 MED ORDER — TRAMADOL HCL 50 MG PO TABS: 50.0000 mg | ORAL_TABLET | Freq: Four times a day (QID) | ORAL | 0 refills | Status: DC | PRN

## 2022-07-31 NOTE — Progress Notes (Signed)
Potassium level up today. Pt to dc home. No complaints voiced. Refused 10 am medications stating " I'll take my medications when I get home." Assessment unchanged. Pt verbalized understanding of dc instructions including medications, follow up care and when to the MD. Discharged via wc to front entrance by NT.

## 2022-07-31 NOTE — Progress Notes (Signed)
2 Days Post-Op   Subjective/Chief Complaint: No complaints. Ready to go home   Objective: Vital signs in last 24 hours: Temp:  [97.3 F (36.3 C)-98.3 F (36.8 C)] 98.3 F (36.8 C) (12/17 0548) Pulse Rate:  [64-87] 78 (12/17 0548) Resp:  [16-18] 16 (12/17 0548) BP: (94-145)/(52-88) 144/71 (12/17 0548) SpO2:  [95 %-99 %] 95 % (12/17 0548) Last BM Date : 07/30/22  Intake/Output from previous day: 12/16 0701 - 12/17 0700 In: 2024 [P.O.:300; I.V.:1424; IV Piggyback:300] Out: -  Intake/Output this shift: No intake/output data recorded.  General appearance: alert and cooperative Resp: clear to auscultation bilaterally Cardio: regular rate and rhythm GI: soft, minimal tenderness  Lab Results:  Recent Labs    07/30/22 0539 07/31/22 0530  WBC 13.4* 14.9*  HGB 9.9* 10.9*  HCT 31.0* 34.0*  PLT 271 296   BMET Recent Labs    07/30/22 1136 07/31/22 0530  NA 135 135  K 2.6* 3.4*  CL 102 102  CO2 22 25  GLUCOSE 120* 103*  BUN 13 14  CREATININE 1.26* 0.90  CALCIUM 8.7* 8.8*   PT/INR No results for input(s): "LABPROT", "INR" in the last 72 hours. ABG No results for input(s): "PHART", "HCO3" in the last 72 hours.  Invalid input(s): "PCO2", "PO2"  Studies/Results: No results found.  Anti-infectives: Anti-infectives (From admission, onward)    Start     Dose/Rate Route Frequency Ordered Stop   07/29/22 0600  cefoTEtan (CEFOTAN) 2 g in sodium chloride 0.9 % 100 mL IVPB        2 g 200 mL/hr over 30 Minutes Intravenous On call to O.R. 07/29/22 0534 07/29/22 0813   07/29/22 0540  sodium chloride 0.9 % with cefoTEtan (CEFOTAN) ADS Med       Note to Pharmacy: Guadelupe Sabin B: cabinet override      07/29/22 0540 07/29/22 0755       Assessment/Plan: s/p Procedure(s): XI ROBOTIC ASSISTED REDO LOWER ANTERIOR RESECTION (N/A) FLEXIBLE SIGMOIDOSCOPY (N/A) POSSIBLE OSTOMY (N/A) CYSTOSCOPY with FIREFLY INJECTION (N/A) Advance diet K 3.4 D/c this am I am a little  concerned about her wbc but she looks great  LOS: 2 days    Autumn Messing III 07/31/2022

## 2022-08-01 NOTE — Discharge Summary (Addendum)
Patient ID: Tracy Blackwell MRN: [id number] DOB/AGE: [birth year] 88 y.o.  Admit date: 07/29/2022 Discharge date: 07/31/2022  Discharge Diagnoses Patient Active Problem List   Diagnosis Date Noted   S/P laparoscopic-assisted sigmoidectomy 44/92/0100   Umbilical hernia s/p primary repair 08/14/2017 08/15/2017   Hypertension    Rectosigmoid cancer s/p LAR resection 08/14/2017 08/14/2017    Procedures OR 12.15/23  Robotic assisted low anterior resection Flexible sigmoidoscopy Bilateral transversus abdominus plane (TAP) blocks  Hospital Course: She was admitted postoperatively and recovered well. She was seen by my partners 12/16 & 12/17. Her diet was gradually advanced and she tolerated well. Her potassium was low postoperatively (hypokalemia) and replaced appropriately. She was deemed stable for discharge home 12/17 and follow-up arranged. She was comfortable with the plan.    Allergies as of 07/31/2022       Reactions   Wasp Venom Anaphylaxis   Codeine Nausea And Vomiting        Medication List     STOP taking these medications    neomycin 500 MG tablet Commonly known as: MYCIFRADIN       TAKE these medications    amLODipine 5 MG tablet Commonly known as: NORVASC Take 5 mg by mouth daily.   LORazepam 0.5 MG tablet Commonly known as: ATIVAN Take 0.5 mg by mouth as needed for anxiety.   losartan-hydrochlorothiazide 100-12.5 MG tablet Commonly known as: HYZAAR Take 1 tablet by mouth daily.   metoprolol succinate 100 MG 24 hr tablet Commonly known as: TOPROL-XL Take 100 mg by mouth daily.   multivitamin with minerals Tabs tablet Take 1 tablet by mouth daily.   traMADol 50 MG tablet Commonly known as: Ultram Take 1 tablet (50 mg total) by mouth every 6 (six) hours as needed for up to 5 days.   traMADol 50 MG tablet Commonly known as: ULTRAM Take 1 tablet (50 mg total) by mouth every 6 (six) hours as needed (postop pain not controlled with ibuprofen  first).   ZINC PO Take 1 tablet by mouth daily.          Follow-up Information     Ileana Roup, MD. Schedule an appointment as soon as possible for a visit in 1 month(s).   Specialties: General Surgery, Colon and Rectal Surgery Contact information: [address] [place] [id number] [place] Dema Severin, M.D. [facility]

## 2022-08-10 ENCOUNTER — Other Ambulatory Visit: Payer: Self-pay

## 2022-08-10 NOTE — Progress Notes (Signed)
The proposed treatment discussed in conference is for discussion purpose only and is not a binding recommendation.  The patients have not been physically examined, or presented with their treatment options.  Therefore, final treatment plans cannot be decided.  

## 2022-08-12 ENCOUNTER — Inpatient Hospital Stay: Payer: Medicare Other | Admitting: Oncology

## 2022-08-30 ENCOUNTER — Inpatient Hospital Stay: Payer: Medicare Other | Attending: Oncology | Admitting: Oncology

## 2022-08-30 VITALS — BP 142/78 | HR 83 | Temp 98.2°F | Resp 18 | Ht 62.0 in | Wt 141.8 lb

## 2022-08-30 DIAGNOSIS — Z9049 Acquired absence of other specified parts of digestive tract: Secondary | ICD-10-CM | POA: Insufficient documentation

## 2022-08-30 DIAGNOSIS — K6389 Other specified diseases of intestine: Secondary | ICD-10-CM

## 2022-08-30 DIAGNOSIS — Z85038 Personal history of other malignant neoplasm of large intestine: Secondary | ICD-10-CM | POA: Insufficient documentation

## 2022-08-30 NOTE — Progress Notes (Signed)
[facility] OFFICE PROGRESS NOTE   Diagnosis: Colon cancer  INTERVAL HISTORY:   Ms. Cariker was taken to the operating room by Dr. Dema Severin on 07/29/2022 for a robotic assisted low anterior resection.  A mass was noted at the previous anastomotic site.  The peritoneal and liver surfaces appeared normal.  An anastomosis is at 10 cm from the anal verge.  She feels well at present.  Her bowels are functioning.  She is here today with her sister-in-law.  Objective:  Vital signs in last 24 hours:  Blood pressure (!) 142/78, pulse 83, temperature 98.2 F (36.8 C), temperature source Oral, resp. rate 18, height '5\' 2"'$  (1.575 m), weight 141 lb 12.8 oz (64.3 kg), SpO2 100 %.   Lymphatics: No cervical, supraclavicular, axillary, or inguinal nodes Resp: End inspiratory rhonchi at the left posterior base Cardio: Regular rate and rhythm GI: No hepatosplenomegaly, healed surgical incisions, firm nodularity surrounding the left greater than right side of the low transverse incision. Vascular: Leg edema   Lab Results:  Lab Results  Component Value Date   WBC 14.9 (H) 07/31/2022   HGB 10.9 (L) 07/31/2022   HCT 34.0 (L) 07/31/2022   MCV 87.9 07/31/2022   PLT 296 07/31/2022   NEUTROABS 5.5 07/22/2022    CMP  Lab Results  Component Value Date   NA 135 07/31/2022   K 3.4 (L) 07/31/2022   CL 102 07/31/2022   CO2 25 07/31/2022   GLUCOSE 103 (H) 07/31/2022   BUN 14 07/31/2022   CREATININE 0.90 07/31/2022   CALCIUM 8.8 (L) 07/31/2022   PROT 6.9 07/22/2022   ALBUMIN 3.5 07/22/2022   AST 25 07/22/2022   ALT 14 07/22/2022   ALKPHOS 73 07/22/2022   BILITOT 0.7 07/22/2022   GFRNONAA >60 07/31/2022   GFRAA >60 08/16/2017    Lab Results  Component Value Date   CEA1 3.06 10/11/2018   CEA 2.63 06/07/2022    Medications: I have reviewed the patient's current medications.   Assessment/Plan: Adenocarcinoma of the colon Partially obstructing mass at 25 cm on colonoscopy  07/17/2017 with a biopsy confirming moderately differentiated adenocarcinoma, incomplete colonoscopy CT abdomen/pelvis 07/05/2017-rectosigmoid mass with evidence of transmural spread and regional adenopathy, no evidence of distant metastatic disease Laparoscopic anterior resection 08/14/2017, stage II (T3N0) moderately differentiated adenocarcinoma, 0/22 lymphnodes positive for metastatic carcinoma, MSI-stable, no loss of mismatch repair protein expression Colonoscopy 10/23/2017-diverticulosis nonbleeding internal hemorrhoids  Sigmoidoscopy 05/30/2022-partially obstructing large mass at 10 cm proximal to the anus-circumferential, the scope could not be advanced beyond the mass-invasive moderately differentiated adenocarcinoma, no loss of mismatch repair protein expression CTs 06/08/2022-distal colonic resection with an anastomotic suture line in the left pelvis, wall thickening involving 5 cm of bowel proximal to the suture line in the entire rectum with soft tissue stranding in the adjacent mesocolon, small perirectal lymph nodes with no evidence of distant metastatic disease, nonobstructing renal calculi, multiple renal cyst Low anterior resection 07/29/2022-mass at the prior anastomotic site, no evidence of metastatic disease, anastomosis at 10 cm from the anal verge, recurrent moderately differentiated adenocarcinoma extending into pericolonic soft tissue at anastomotic site, negative margins, 0/20 nodes, adherent adipose with an underlying 3 mm defect extending into the lumen of the specimen, no lymphovascular or perineural invasion, MSS, no loss of mismatch repair protein expression  Hypertension Loose stool and rectal bleeding-likely secondary to #1     Disposition: Ms. Grether underwent a low anterior resection for treatment of a locally recurrent colon cancer.  She appears  to undergone a complete pathologic resection.  There is no clinical or radiologic evidence of distant metastatic disease.   I will be sure Dr. Dema Severin is comfortable with the margin status.  Her case was presented at the GI tumor conference.  The recommendation from the GI oncology group is to consider adjuvant Xeloda versus observation.  Ms. Auxier understands the unusual nature of her tumor.  It is difficult to estimate the chance of developing local recurrence or distant metastatic disease in patients with a local recurrence of colon cancer.  The tumor appeared to behave in an indolent fashion as she is 5 years out from the 2018 colon cancer diagnosis.  We discussed a course of adjuvant Xeloda versus observation.  The goal of Xeloda is to increase the cure rate.  We discussed the expected approximate 30% decrease in recurrence rate 1 Xeloda was given in the adjuvant setting for patients with an initial diagnosis of colon cancer.  We reviewed potential toxicities associated with Xeloda including the chance of mucositis, diarrhea, rash, hyperpigmentation, sun sensitivity, and hand/foot syndrome.  Ms. Tauzin is concerned of potential toxicities associated with Xeloda, especially diarrhea.  She also does not wish to take multiple pills.  She is most comfortable with observation.  She will return for an office visit and CEA in 3 months.  We will plan for surveillance CTs in 6-12 months.  She will need a colonoscopy in 6-12 months. Betsy Coder, MD  08/30/2022  11:13 AM

## 2022-09-29 DIAGNOSIS — M9905 Segmental and somatic dysfunction of pelvic region: Secondary | ICD-10-CM | POA: Diagnosis not present

## 2022-09-29 DIAGNOSIS — M9901 Segmental and somatic dysfunction of cervical region: Secondary | ICD-10-CM | POA: Diagnosis not present

## 2022-09-29 DIAGNOSIS — M542 Cervicalgia: Secondary | ICD-10-CM | POA: Diagnosis not present

## 2022-09-29 DIAGNOSIS — M9903 Segmental and somatic dysfunction of lumbar region: Secondary | ICD-10-CM | POA: Diagnosis not present

## 2022-10-03 DIAGNOSIS — M9903 Segmental and somatic dysfunction of lumbar region: Secondary | ICD-10-CM | POA: Diagnosis not present

## 2022-10-03 DIAGNOSIS — M9901 Segmental and somatic dysfunction of cervical region: Secondary | ICD-10-CM | POA: Diagnosis not present

## 2022-10-03 DIAGNOSIS — M9905 Segmental and somatic dysfunction of pelvic region: Secondary | ICD-10-CM | POA: Diagnosis not present

## 2022-10-03 DIAGNOSIS — M542 Cervicalgia: Secondary | ICD-10-CM | POA: Diagnosis not present

## 2022-10-05 DIAGNOSIS — M9903 Segmental and somatic dysfunction of lumbar region: Secondary | ICD-10-CM | POA: Diagnosis not present

## 2022-10-05 DIAGNOSIS — M542 Cervicalgia: Secondary | ICD-10-CM | POA: Diagnosis not present

## 2022-10-05 DIAGNOSIS — M9901 Segmental and somatic dysfunction of cervical region: Secondary | ICD-10-CM | POA: Diagnosis not present

## 2022-10-05 DIAGNOSIS — M9905 Segmental and somatic dysfunction of pelvic region: Secondary | ICD-10-CM | POA: Diagnosis not present

## 2022-10-12 DIAGNOSIS — M9903 Segmental and somatic dysfunction of lumbar region: Secondary | ICD-10-CM | POA: Diagnosis not present

## 2022-10-12 DIAGNOSIS — M9901 Segmental and somatic dysfunction of cervical region: Secondary | ICD-10-CM | POA: Diagnosis not present

## 2022-10-12 DIAGNOSIS — M9905 Segmental and somatic dysfunction of pelvic region: Secondary | ICD-10-CM | POA: Diagnosis not present

## 2022-10-12 DIAGNOSIS — M542 Cervicalgia: Secondary | ICD-10-CM | POA: Diagnosis not present

## 2022-10-17 DIAGNOSIS — M9901 Segmental and somatic dysfunction of cervical region: Secondary | ICD-10-CM | POA: Diagnosis not present

## 2022-10-17 DIAGNOSIS — M9905 Segmental and somatic dysfunction of pelvic region: Secondary | ICD-10-CM | POA: Diagnosis not present

## 2022-10-17 DIAGNOSIS — M9903 Segmental and somatic dysfunction of lumbar region: Secondary | ICD-10-CM | POA: Diagnosis not present

## 2022-10-17 DIAGNOSIS — M542 Cervicalgia: Secondary | ICD-10-CM | POA: Diagnosis not present

## 2022-10-24 DIAGNOSIS — M9905 Segmental and somatic dysfunction of pelvic region: Secondary | ICD-10-CM | POA: Diagnosis not present

## 2022-10-24 DIAGNOSIS — M542 Cervicalgia: Secondary | ICD-10-CM | POA: Diagnosis not present

## 2022-10-24 DIAGNOSIS — M9901 Segmental and somatic dysfunction of cervical region: Secondary | ICD-10-CM | POA: Diagnosis not present

## 2022-10-24 DIAGNOSIS — M9903 Segmental and somatic dysfunction of lumbar region: Secondary | ICD-10-CM | POA: Diagnosis not present

## 2022-10-26 LAB — SURGICAL PATHOLOGY

## 2022-11-03 DIAGNOSIS — M9903 Segmental and somatic dysfunction of lumbar region: Secondary | ICD-10-CM | POA: Diagnosis not present

## 2022-11-03 DIAGNOSIS — M9905 Segmental and somatic dysfunction of pelvic region: Secondary | ICD-10-CM | POA: Diagnosis not present

## 2022-11-03 DIAGNOSIS — M542 Cervicalgia: Secondary | ICD-10-CM | POA: Diagnosis not present

## 2022-11-03 DIAGNOSIS — M9901 Segmental and somatic dysfunction of cervical region: Secondary | ICD-10-CM | POA: Diagnosis not present

## 2022-11-09 DIAGNOSIS — M9901 Segmental and somatic dysfunction of cervical region: Secondary | ICD-10-CM | POA: Diagnosis not present

## 2022-11-09 DIAGNOSIS — M9903 Segmental and somatic dysfunction of lumbar region: Secondary | ICD-10-CM | POA: Diagnosis not present

## 2022-11-09 DIAGNOSIS — M9905 Segmental and somatic dysfunction of pelvic region: Secondary | ICD-10-CM | POA: Diagnosis not present

## 2022-11-09 DIAGNOSIS — M542 Cervicalgia: Secondary | ICD-10-CM | POA: Diagnosis not present

## 2022-11-16 DIAGNOSIS — M9903 Segmental and somatic dysfunction of lumbar region: Secondary | ICD-10-CM | POA: Diagnosis not present

## 2022-11-16 DIAGNOSIS — Z91038 Other insect allergy status: Secondary | ICD-10-CM | POA: Diagnosis not present

## 2022-11-16 DIAGNOSIS — M9901 Segmental and somatic dysfunction of cervical region: Secondary | ICD-10-CM | POA: Diagnosis not present

## 2022-11-16 DIAGNOSIS — I10 Essential (primary) hypertension: Secondary | ICD-10-CM | POA: Diagnosis not present

## 2022-11-16 DIAGNOSIS — M542 Cervicalgia: Secondary | ICD-10-CM | POA: Diagnosis not present

## 2022-11-16 DIAGNOSIS — M9905 Segmental and somatic dysfunction of pelvic region: Secondary | ICD-10-CM | POA: Diagnosis not present

## 2022-11-16 DIAGNOSIS — E782 Mixed hyperlipidemia: Secondary | ICD-10-CM | POA: Diagnosis not present

## 2022-11-25 DIAGNOSIS — M9903 Segmental and somatic dysfunction of lumbar region: Secondary | ICD-10-CM | POA: Diagnosis not present

## 2022-11-25 DIAGNOSIS — M542 Cervicalgia: Secondary | ICD-10-CM | POA: Diagnosis not present

## 2022-11-25 DIAGNOSIS — M9901 Segmental and somatic dysfunction of cervical region: Secondary | ICD-10-CM | POA: Diagnosis not present

## 2022-11-25 DIAGNOSIS — M9905 Segmental and somatic dysfunction of pelvic region: Secondary | ICD-10-CM | POA: Diagnosis not present

## 2022-11-28 ENCOUNTER — Inpatient Hospital Stay: Payer: Medicare Other | Attending: Oncology

## 2022-11-28 ENCOUNTER — Inpatient Hospital Stay: Payer: Medicare Other | Admitting: Oncology

## 2022-11-28 VITALS — BP 138/80 | HR 77 | Temp 98.1°F | Resp 18 | Ht 62.0 in | Wt 154.0 lb

## 2022-11-28 DIAGNOSIS — K6389 Other specified diseases of intestine: Secondary | ICD-10-CM

## 2022-11-28 DIAGNOSIS — C19 Malignant neoplasm of rectosigmoid junction: Secondary | ICD-10-CM | POA: Diagnosis not present

## 2022-11-28 DIAGNOSIS — I10 Essential (primary) hypertension: Secondary | ICD-10-CM | POA: Diagnosis not present

## 2022-11-28 LAB — CEA (ACCESS): CEA (CHCC): 3.86 ng/mL (ref 0.00–5.00)

## 2022-11-28 NOTE — Progress Notes (Signed)
[name] OFFICE PROGRESS NOTE   Diagnosis: Colon cancer  INTERVAL HISTORY:   Ms. Chestang returns as scheduled.  She feels well.  Good appetite.  No difficulty with bowel function.  No bleeding.  She was recently started on Myrbetriq for urinary incontinence.  Objective:  Vital signs in last 24 hours:  Blood pressure 138/80, pulse 77, temperature 98.1 F (36.7 C), temperature source Oral, resp. rate 18, height 5\' 2"  (1.575 m), weight 154 lb (69.9 kg), SpO2 99 %.     Lymphatics: No cervical, supraclavicular, right axillary axillary, or inguinal nodes.  1 cm soft mobile left axillary fat pad versus small lymph node Resp: Early inspiratory wheeze and end inspiratory rhonchi at the upper posterior chest that cleared after several respirations Cardio: Regular rate and rhythm GI: No hepatosplenomegaly, no mass, nontender Vascular: No leg edema   Lab Results  Component Value Date   WBC 14.9 (H) 07/31/2022   HGB 10.9 (L) 07/31/2022   HCT 34.0 (L) 07/31/2022   MCV 87.9 07/31/2022   PLT 296 07/31/2022   NEUTROABS 5.5 07/22/2022    CMP  Lab Results  Component Value Date   NA 135 07/31/2022   K 3.4 (L) 07/31/2022   CL 102 07/31/2022   CO2 25 07/31/2022   GLUCOSE 103 (H) 07/31/2022   BUN 14 07/31/2022   CREATININE 0.90 07/31/2022   CALCIUM 8.8 (L) 07/31/2022   PROT 6.9 07/22/2022   ALBUMIN 3.5 07/22/2022   AST 25 07/22/2022   ALT 14 07/22/2022   ALKPHOS 73 07/22/2022   BILITOT 0.7 07/22/2022   GFRNONAA >60 07/31/2022   GFRAA >60 08/16/2017    Lab Results  Component Value Date   CEA1 3.06 10/11/2018   CEA 3.86 11/28/2022    Lab Results  Component Value Date   INR 0.9 06/22/2007   LABPROT 11.8 06/22/2007    Imaging:  No results found.  Medications: I have reviewed the patient's current medications.   Assessment/Plan: Adenocarcinoma of the colon Partially obstructing mass at 25 cm on colonoscopy 07/17/2017 with a biopsy confirming  moderately differentiated adenocarcinoma, incomplete colonoscopy CT abdomen/pelvis 07/05/2017-rectosigmoid mass with evidence of transmural spread and regional adenopathy, no evidence of distant metastatic disease Laparoscopic anterior resection 08/14/2017, stage II (T3N0) moderately differentiated adenocarcinoma, 0/22 lymphnodes positive for metastatic carcinoma, MSI-stable, no loss of mismatch repair protein expression Colonoscopy 10/23/2017-diverticulosis nonbleeding internal hemorrhoids  Sigmoidoscopy 05/30/2022-partially obstructing large mass at 10 cm proximal to the anus-circumferential, the scope could not be advanced beyond the mass-invasive moderately differentiated adenocarcinoma, no loss of mismatch repair protein expression CTs 06/08/2022-distal colonic resection with an anastomotic suture line in the left pelvis, wall thickening involving 5 cm of bowel proximal to the suture line in the entire rectum with soft tissue stranding in the adjacent mesocolon, small perirectal lymph nodes with no evidence of distant metastatic disease, nonobstructing renal calculi, multiple renal cyst Low anterior resection 07/29/2022-mass at the prior anastomotic site, no evidence of metastatic disease, anastomosis at 10 cm from the anal verge, recurrent moderately differentiated adenocarcinoma extending into pericolonic soft tissue at anastomotic site, negative margins, 0/20 nodes, adherent adipose with an underlying 3 mm defect extending into the lumen of the specimen, no lymphovascular or perineural invasion, MSS, no loss of mismatch repair protein expression  Hypertension Loose stool and rectal bleeding-likely secondary to #1      Disposition: Tracy Blackwell is in clinical remission from colon cancer.  The CEA is normal.  We discussed surveillance CT imaging and referral  for a colonoscopy.  She does not wish to undergo CTs or a colonoscopy at present. She will return for an office visit and CEA in 4 months.   She will call for new symptoms.  Thornton Papas, MD  11/28/2022  11:59 AM

## 2022-12-01 DIAGNOSIS — M542 Cervicalgia: Secondary | ICD-10-CM | POA: Diagnosis not present

## 2022-12-01 DIAGNOSIS — M9901 Segmental and somatic dysfunction of cervical region: Secondary | ICD-10-CM | POA: Diagnosis not present

## 2022-12-01 DIAGNOSIS — M9903 Segmental and somatic dysfunction of lumbar region: Secondary | ICD-10-CM | POA: Diagnosis not present

## 2022-12-01 DIAGNOSIS — M9905 Segmental and somatic dysfunction of pelvic region: Secondary | ICD-10-CM | POA: Diagnosis not present

## 2022-12-14 DIAGNOSIS — M9901 Segmental and somatic dysfunction of cervical region: Secondary | ICD-10-CM | POA: Diagnosis not present

## 2022-12-14 DIAGNOSIS — M9905 Segmental and somatic dysfunction of pelvic region: Secondary | ICD-10-CM | POA: Diagnosis not present

## 2022-12-14 DIAGNOSIS — M9903 Segmental and somatic dysfunction of lumbar region: Secondary | ICD-10-CM | POA: Diagnosis not present

## 2022-12-14 DIAGNOSIS — M542 Cervicalgia: Secondary | ICD-10-CM | POA: Diagnosis not present

## 2022-12-21 DIAGNOSIS — M9901 Segmental and somatic dysfunction of cervical region: Secondary | ICD-10-CM | POA: Diagnosis not present

## 2022-12-21 DIAGNOSIS — M9905 Segmental and somatic dysfunction of pelvic region: Secondary | ICD-10-CM | POA: Diagnosis not present

## 2022-12-21 DIAGNOSIS — M542 Cervicalgia: Secondary | ICD-10-CM | POA: Diagnosis not present

## 2022-12-21 DIAGNOSIS — M9903 Segmental and somatic dysfunction of lumbar region: Secondary | ICD-10-CM | POA: Diagnosis not present

## 2022-12-27 ENCOUNTER — Encounter: Payer: Self-pay | Admitting: Oncology

## 2023-03-08 DIAGNOSIS — C189 Malignant neoplasm of colon, unspecified: Secondary | ICD-10-CM | POA: Diagnosis not present

## 2023-03-08 NOTE — Progress Notes (Signed)
 "   REFERRING PHYSICIAN:  Self  PROVIDER:  LONNI OZELL PIZZA, MD  MRN: [id number] DOB: May 06, 1934 DATE OF ENCOUNTER: 03/08/2023  Subjective   Chief Complaint: Colorectal cancer   History of Present Illness: Tracy Blackwell is a 89 y.o. female with history of HTN, colon cancer, whom is seen in the office today as a referral by Dr. Arch for evaluation of colorectal cancer.  She is known to our practice.  She has a history of sigmoid colon cancer.  She underwent laparoscopic-assisted low anterior resection 08/14/2017 by myself.  She had previously undergone staging CT scans which demonstrated a partially obstructive lesion at the rectosigmoid colon.  There is no distant metastatic disease that was evident.  Previously, colonoscopy with Dr. Herschell had showed a large villous-like lesion in the rectosigmoid colon at 25 cm.  This was biopsied and found to be invasive adenocarcinoma.  08/14/17: Laparoscopic LAR -  PATH - invasive adenocarcinoma, 5 cm; pT3N0 (0/22) M0. Margins negative.   She recovered uneventfully and was discharged home 08/16/2017.  She recovered well and is seen in follow-up.  She also met with Dr. Cloretta with medical oncology.  Further surveillance was advised.  She followed back up with medical oncology 03/06/2018.  Completion colonoscopy was recommended.    She was subsequent lost to follow-up.  CT chest/abdomen/pelvis 06/08/2022 Dr. Cloretta showed: 1. Signs of previous distal colonic resection with colorectal anastomotic suture line within the left hemipelvis. There is wall thickening involving the 5 cm of bowel proximal suture chain as well as the entirety of the rectum. Mild soft tissue stranding is noted within the adjacent mesocolon. Additionally, small perirectal lymph nodes are noted within the pelvis measuring up to 8 mm. Underlying locally recurrent tumor would be difficult to exclude. Consider further evaluation with direct visualization. 2. No signs  of distant metastatic disease within the chest, abdomen or pelvis. 3. Bilateral nonobstructing renal calculi. 4. Right upper quadrant abdominal wall hernia contains fat only. 5. Multiple fluid attenuating cysts are identified bilaterally compatible with benign Bosniak class I and II lesions. No follow-up imaging recommended. 6.  Aortic Atherosclerosis (ICD10-I70.0).   Flex sig completed with Dr. Saintclair 05/30/2022-fax report indicates: 1.  Hemorrhoids 2.  Fungating infiltrative polypoid villous lichen sessile partially obstructing mass at approximately 10 cm from the anus.  Circumferential.  Biopsied.   She reports that the flex sig was completed for screening/surveillance purposes.  She denies any symptoms preceding any of this or since.  No abdominal pain.  No nausea, no vomiting.  She denies any blood in her stool or weight changes.  She otherwise feels great.  She is here today with her daughter.  Pelvic MRI 06/13/22 - 1. Status post low anterior resection with rectosigmoid anastomosis. Confirmation of local recurrent surrounding the surgical site. 2. Direct tumor extension into the sigmoid mesocolon with suspicious regional adenopathy.  Her case was presented at our multidisciplinary tumor board and the consensus recommendation was for upfront surgery if we are going to go down the route of curative intent.  She denies any complaints at present.  She did have what sounds like a bout of food poisoning that her son/grandson also experienced following some bad salami.  This was a couple weeks ago.  Now having regular bowel movements.  No diarrhea or nausea/vomiting.  No abdominal pain.  INTERVAL HX OR 07/29/22 - robotic LAR, flex sig Findings: Mass occupying the proximal site of the prior anastomosis.  Normal peritoneal surfaces.  Normal surface of liver.  A low anterior resection was carried out.  A well perfused, tension free, hemostatic, air tight 29 mm EEA colorectal anastomosis  fashioned 10 cm from the anal verge by flexible sigmoidoscopy.   PATH:  A. COLON, RECTOSIGMOID, RESECTION:  -  Recurrent moderately differentiated adenocarcinoma extending into  pericolonic/perirectal soft tissue (see comment and synoptic report  below) at anastomotic site.  -  Margins are interpreted as negative; however, mesorectum completeness  is not grossly apparent (see note and synoptic report), surgical  correlation necessary.  -  18 lymph nodes, negative for malignancy (0/2)  rpT3 pN0   B. LYMPH NODES, ADDITIONAL SIGMOID, EXCISION:  -  2 lymph nodes, negative for malignancy (0/2)   C. FINAL DISTAL MARGIN:  -  Unremarkable/viable colon, negative for malignancy.   D. PROXIMAL MARGIN:  -  Unremarkable/viable colon, negative for malignancy.   Mismatch repair protein intact by IHC.  She is here today for follow-up. She continues to do quite well.  No fatigue, good appetite.  No nausea, vomiting. She denies any abdominal pain.  She is having regular bowel movements.  She denies any issues with constipation or diarrhea.  No blood in her stool.  No incisional complaints or hernias  PMH: HTN  PSH: Laparoscopic LAR 2018  FHx: Denies any known family history of colorectal, breast, endometrial or ovarian cancer  Social Hx: Denies use of tobacco/illicit drug; social EtOH use; here today with her daughter    Medical History: Past Medical History:  Diagnosis Date   Arthritis    History of cancer    Hypertension     There is no problem list on file for this patient.   Past Surgical History:  Procedure Laterality Date   COLON SURGERY       Allergies  Allergen Reactions   Wasp Venom Anaphylaxis   Codeine Nausea And Vomiting    Current Outpatient Medications on File Prior to Visit  Medication Sig Dispense Refill   amLODIPine  (NORVASC ) 5 MG tablet Take 5 mg by mouth every morning     losartan -hydroCHLOROthiazide  (HYZAAR) 100-12.5 mg tablet Take 1 tablet by  mouth every morning     metoprolol  succinate (TOPROL -XL) 100 MG XL tablet Take 100 mg by mouth every morning     multivitamin with minerals tablet Take 1 tablet by mouth once daily     naproxen sodium (ALEVE) 220 MG tablet Take by mouth     No current facility-administered medications on file prior to visit.    History reviewed. No pertinent family history.   Social History   Tobacco Use  Smoking Status Never  Smokeless Tobacco Never     Social History   Socioeconomic History   Marital status: Widowed  Tobacco Use   Smoking status: Never   Smokeless tobacco: Never  Substance and Sexual Activity   Alcohol use: Yes   Drug use: Never   Social Determinants of Health   Food Insecurity: No Food Insecurity (07/29/2022)   Received from [facility] [place]   Hunger Vital Sign    Worried About Running Out of Food in the Last Year: Never true    Ran Out of Food in the Last Year: Never true  Transportation Needs: No Transportation Needs (07/29/2022)   Received from [facility] [place]   PRAPARE - Transportation    Lack of Transportation (Medical): No    Lack of Transportation (Non-Medical): No    Objective:    Vitals:   03/08/23 1002  PainSc: 0-No pain  There is no height or weight on file to calculate BMI.  Constitutional: NAD; conversant Eyes: Moist conjunctiva; anicteric Lungs: Normal respiratory effort CV: RRR GI: Abdomen is soft, NT/ND.  Incisions are well-healed.  No drainage nor erythema.  No palpable hernias. Psychiatric: Appropriate affect  Assessment and Plan:  Diagnoses and all orders for this visit:  Local recurrence of malignant neoplasm of colon (CMS/HHS-HCC)        Maidie Gang is a very pleasant 89 y.o. female with hx of HTN, sigmoid colon here for evaluation of now evident distal sigmoid cancer found on flex sig 05/2022   Restaging CT chest/abdomen/pelvis 06/08/2022 demonstrates no evidence of metastatic  disease.  -Pelvic MRI 06/13/22 -mass appears to extend proximal to the anastomosis and not distal to it.  This therefore extends up into the sigmoid mesocolon with some suspicious regional adenopathy.  -Case was discussed at our multidisciplinary tumor board and the consensus recommendation was for surgery first approach if in line with her goals. She is motivated to pursue curative intent treatment and surgery.  OR 07/29/22 - robotic LAR, flex sig pT3N0 (0/22), discussed that this may actually be T4a based on intraoperative findings at multidisciplinary tumor board; oncology follow up underway.  -We discussed general recommendations for surveillance as per NCCN moving forward as well.  We discussed colonoscopy at the 1 year mark.  We discussed surveillance CT scans every 6 to 12 months- following with medical oncology Dr. Cloretta -noted she has refused undergoing surveillance CTs or colonoscopy. -We will see her back in 1 yr for follow-up or sooner if anything changes. She has been encouraged to call with any questions or concerns  Return in about 1 year (around 03/07/2024).  I spent a total of 30 minutes in both face-to-face and non-face-to-face activities, excluding procedures performed, for this visit on the date of this encounter.   Lonni Pizza, MD [facility]  "

## 2023-03-30 ENCOUNTER — Inpatient Hospital Stay: Payer: Medicare HMO | Attending: Oncology

## 2023-03-30 ENCOUNTER — Inpatient Hospital Stay: Payer: Medicare HMO | Admitting: Oncology

## 2023-03-30 VITALS — BP 132/70 | HR 80 | Temp 98.1°F | Resp 18 | Ht 62.0 in | Wt 161.0 lb

## 2023-03-30 DIAGNOSIS — I10 Essential (primary) hypertension: Secondary | ICD-10-CM | POA: Diagnosis not present

## 2023-03-30 DIAGNOSIS — K6389 Other specified diseases of intestine: Secondary | ICD-10-CM

## 2023-03-30 DIAGNOSIS — Z85038 Personal history of other malignant neoplasm of large intestine: Secondary | ICD-10-CM | POA: Diagnosis not present

## 2023-03-30 DIAGNOSIS — C19 Malignant neoplasm of rectosigmoid junction: Secondary | ICD-10-CM

## 2023-03-30 LAB — CEA (ACCESS): CEA (CHCC): 3.4 ng/mL (ref 0.00–5.00)

## 2023-03-30 NOTE — Progress Notes (Signed)
  [name] OFFICE PROGRESS NOTE   Diagnosis: Colon cancer  INTERVAL HISTORY:   Tracy Blackwell returns as scheduled.  She feels well.  No difficulty with bowel function.  No bleeding.  Good appetite.  Objective:  Vital signs in last 24 hours:  Blood pressure 132/70, pulse 80, temperature 98.1 F (36.7 C), temperature source Oral, resp. rate 18, height 5\' 2"  (1.575 m), weight 161 lb (73 kg), SpO2 97%.     Lymphatics: No cervical, supraclavicular, axillary, or inguinal nodes Resp: Lungs clear bilaterally Cardio: Regular rate and rhythm GI: No hepatosplenomegaly, no mass, nontender Vascular: No leg edema    Lab Results:  Lab Results  Component Value Date   WBC 14.9 (H) 07/31/2022   HGB 10.9 (L) 07/31/2022   HCT 34.0 (L) 07/31/2022   MCV 87.9 07/31/2022   PLT 296 07/31/2022   NEUTROABS 5.5 07/22/2022    CMP  Lab Results  Component Value Date   NA 135 07/31/2022   K 3.4 (L) 07/31/2022   CL 102 07/31/2022   CO2 25 07/31/2022   GLUCOSE 103 (H) 07/31/2022   BUN 14 07/31/2022   CREATININE 0.90 07/31/2022   CALCIUM 8.8 (L) 07/31/2022   PROT 6.9 07/22/2022   ALBUMIN 3.5 07/22/2022   AST 25 07/22/2022   ALT 14 07/22/2022   ALKPHOS 73 07/22/2022   BILITOT 0.7 07/22/2022   GFRNONAA >60 07/31/2022   GFRAA >60 08/16/2017    Lab Results  Component Value Date   CEA1 3.06 10/11/2018   CEA 3.86 11/28/2022     Medications: I have reviewed the patient's current medications.   Assessment/Plan:  Adenocarcinoma of the colon Partially obstructing mass at 25 cm on colonoscopy 07/17/2017 with a biopsy confirming moderately differentiated adenocarcinoma, incomplete colonoscopy CT abdomen/pelvis 07/05/2017-rectosigmoid mass with evidence of transmural spread and regional adenopathy, no evidence of distant metastatic disease Laparoscopic anterior resection 08/14/2017, stage II (T3N0) moderately differentiated adenocarcinoma, 0/22 lymphnodes positive for  metastatic carcinoma, MSI-stable, no loss of mismatch repair protein expression Colonoscopy 10/23/2017-diverticulosis nonbleeding internal hemorrhoids  Sigmoidoscopy 05/30/2022-partially obstructing large mass at 10 cm proximal to the anus-circumferential, the scope could not be advanced beyond the mass-invasive moderately differentiated adenocarcinoma, no loss of mismatch repair protein expression CTs 06/08/2022-distal colonic resection with an anastomotic suture line in the left pelvis, wall thickening involving 5 cm of bowel proximal to the suture line in the entire rectum with soft tissue stranding in the adjacent mesocolon, small perirectal lymph nodes with no evidence of distant metastatic disease, nonobstructing renal calculi, multiple renal cyst Low anterior resection 07/29/2022-mass at the prior anastomotic site, no evidence of metastatic disease, anastomosis at 10 cm from the anal verge, recurrent moderately differentiated adenocarcinoma extending into pericolonic soft tissue at anastomotic site, negative margins, 0/20 nodes, adherent adipose with an underlying 3 mm defect extending into the lumen of the specimen, no lymphovascular or perineural invasion, MSS, no loss of mismatch repair protein expression  Hypertension Loose stool and rectal bleeding-likely secondary to #1     Disposition: Tracy Blackwell is in clinical remission from colon cancer.  She declines surveillance imaging and a CEA.  She agrees to a follow-up colonoscopy to look for evidence of recurrence at the low anterior resection site.  We will make a referral to Dr. Marca Ancona.  Tracy Blackwell will return for an office visit in 6 months.  I encouraged her to establish care with a primary provider.  Thornton Papas, MD  03/30/2023  10:51 AM

## 2023-03-31 ENCOUNTER — Encounter: Payer: Self-pay | Admitting: *Deleted

## 2023-03-31 NOTE — Progress Notes (Signed)
Faxed referral order, demographics, and medical records to [facility] [phone]. Due colonoscopy 07/2023.

## 2023-09-11 ENCOUNTER — Telehealth: Payer: Self-pay | Admitting: *Deleted

## 2023-09-11 NOTE — Telephone Encounter (Signed)
Having her colonoscopy on 10/13/23 with Dr. Marca Ancona. Has appointment with Dr. Truett Perna on 10/06/23. Would he prefer seeing her before or after the colonoscopy?

## 2023-09-12 NOTE — Telephone Encounter (Signed)
Notified patient that per Dr. Truett Perna, OK to see Dr. Truett Perna before the colonoscopy.

## 2023-10-06 ENCOUNTER — Ambulatory Visit: Payer: Medicare HMO | Admitting: Oncology

## 2023-10-13 ENCOUNTER — Other Ambulatory Visit: Payer: Self-pay | Admitting: Gastroenterology

## 2023-10-13 DIAGNOSIS — Z85038 Personal history of other malignant neoplasm of large intestine: Secondary | ICD-10-CM

## 2023-10-23 ENCOUNTER — Ambulatory Visit
Admission: RE | Admit: 2023-10-23 | Discharge: 2023-10-23 | Disposition: A | Discharge status: Home / Self Care | Source: Ambulatory Visit | Attending: Gastroenterology

## 2023-10-23 DIAGNOSIS — Z85038 Personal history of other malignant neoplasm of large intestine: Secondary | ICD-10-CM

## 2023-10-23 DIAGNOSIS — I700 Atherosclerosis of aorta: Secondary | ICD-10-CM | POA: Diagnosis not present

## 2023-10-23 MED ORDER — IOPAMIDOL (ISOVUE-300) INJECTION 61%
100.0000 mL | Freq: Once | INTRAVENOUS | Status: AC | PRN
  Administered 2023-10-23: 100 mL via INTRAVENOUS

## 2023-11-03 ENCOUNTER — Telehealth: Payer: Self-pay

## 2023-11-03 NOTE — Telephone Encounter (Signed)
 The patient called to request a change to her appointment, opting for a telephone visit instead. She believes that discussing the results over the phone would be more suitable.

## 2023-11-07 ENCOUNTER — Inpatient Hospital Stay: Payer: Self-pay | Attending: Oncology | Admitting: Oncology

## 2023-11-07 ENCOUNTER — Telehealth: Payer: Self-pay | Admitting: *Deleted

## 2023-11-07 DIAGNOSIS — C19 Malignant neoplasm of rectosigmoid junction: Secondary | ICD-10-CM | POA: Diagnosis not present

## 2023-11-07 NOTE — Telephone Encounter (Signed)
 LVM for patient to check MyChart message re: Dr. Kalman Drape thoughts on her CT scan. Routed office note and copy of scan to her new PCP, Dr. Chanetta Marshall.

## 2023-11-07 NOTE — Progress Notes (Signed)
 [facility]   HEMATOLOGY- ONCOLOGY TeleHEALTH OFFICE VISIT PROGRESS NOTE  I connected with Tracy Blackwell on 11/07/23 at  10:00 AM EDT by telephone and verified that I am speaking with the correct person using two identifiers.   I discussed the limitations, risks, security and privacy concerns of performing an evaluation and management service by telemedicine and the availability of in-person appointments. I also discussed with the patient that there may be a patient responsible charge related to this service. The patient expressed understanding and agreed to proceed.   Patient's location: Home Provider's location: Office  Diagnosis: Colon cancer  INTERVAL HISTORY:   Tracy Blackwell is seen for a telehealth visit per her request.  She reports feeling well.  No difficulty with bowel function.  No bleeding.  She had an episode of laryngitis secondary to "polyp".  This has improved.  No other complaint. She saw Dr. Pati Gallo and was referred for a CT abdomen/pelvis.  The CT revealed no evidence of recurrent colon cancer.  She decided against a surveillance colonoscopy.   Lab Results:  Lab Results  Component Value Date   WBC 14.9 (H) 07/31/2022   HGB 10.9 (L) 07/31/2022   HCT 34.0 (L) 07/31/2022   MCV 87.9 07/31/2022   PLT 296 07/31/2022   NEUTROABS 5.5 07/22/2022    Imaging:  No results found.  Medications: I have reviewed the patient's current medications.  Assessment/Plan: Adenocarcinoma of the colon Partially obstructing mass at 25 cm on colonoscopy 07/17/2017 with a biopsy confirming moderately differentiated adenocarcinoma, incomplete colonoscopy CT abdomen/pelvis 07/05/2017-rectosigmoid mass with evidence of transmural spread and regional adenopathy, no evidence of distant metastatic disease Laparoscopic anterior resection 08/14/2017, stage II (T3N0) moderately differentiated adenocarcinoma, 0/22 lymphnodes positive for metastatic carcinoma, MSI-stable, no loss of  mismatch repair protein expression Colonoscopy 10/23/2017-diverticulosis nonbleeding internal hemorrhoids  Sigmoidoscopy 05/30/2022-partially obstructing large mass at 10 cm proximal to the anus-circumferential, the scope could not be advanced beyond the mass-invasive moderately differentiated adenocarcinoma, no loss of mismatch repair protein expression CTs 06/08/2022-distal colonic resection with an anastomotic suture line in the left pelvis, wall thickening involving 5 cm of bowel proximal to the suture line in the entire rectum with soft tissue stranding in the adjacent mesocolon, small perirectal lymph nodes with no evidence of distant metastatic disease, nonobstructing renal calculi, multiple renal cyst Low anterior resection 07/29/2022-mass at the prior anastomotic site, no evidence of metastatic disease, anastomosis at 10 cm from the anal verge, recurrent moderately differentiated adenocarcinoma extending into pericolonic soft tissue at anastomotic site, negative margins, 0/20 nodes, adherent adipose with an underlying 3 mm defect extending into the lumen of the specimen, no lymphovascular or perineural invasion, MSS, no loss of mismatch repair protein expression CT Abd/pelvis 10/23/2023: No acute finding renal cysts  Hypertension Loose stool and rectal bleeding-likely secondary to #1     Disposition: Tracy Blackwell remains in clinical remission from colon cancer.  She was diagnosed with a local recurrence of colon cancer in December 2023.  She requested discharge from the oncology clinic.  She will continue clinical follow-up with Dr. Chanetta Marshall.  I am available to see her in the future as needed.  I will forward results from the 10/23/2023 CT to Dr. Chanetta Marshall.  She can decide on additional imaging to follow-up the renal cysts, but I suspect no imaging is indicated given Tracy Blackwell's age.  I provided 20 minutes of chart review, telephone, and documentation time during this encounter, and > 50%  was spent counseling as documented  under my assessment & plan.  Thornton Papas MD  11/07/2023 10:05 AM

## 2024-02-27 DIAGNOSIS — R739 Hyperglycemia, unspecified: Secondary | ICD-10-CM | POA: Diagnosis not present

## 2024-02-27 DIAGNOSIS — E099 Drug or chemical induced diabetes mellitus without complications: Secondary | ICD-10-CM | POA: Diagnosis not present

## 2024-02-27 DIAGNOSIS — Z79899 Other long term (current) drug therapy: Secondary | ICD-10-CM | POA: Diagnosis not present

## 2024-02-27 DIAGNOSIS — M25512 Pain in left shoulder: Secondary | ICD-10-CM | POA: Diagnosis not present

## 2024-02-27 DIAGNOSIS — E7800 Pure hypercholesterolemia, unspecified: Secondary | ICD-10-CM | POA: Diagnosis not present

## 2024-03-12 DIAGNOSIS — M25511 Pain in right shoulder: Secondary | ICD-10-CM | POA: Diagnosis not present

## 2024-03-12 DIAGNOSIS — M25512 Pain in left shoulder: Secondary | ICD-10-CM | POA: Diagnosis not present

## 2024-04-09 DIAGNOSIS — M25511 Pain in right shoulder: Secondary | ICD-10-CM | POA: Diagnosis not present

## 2024-04-09 DIAGNOSIS — M25512 Pain in left shoulder: Secondary | ICD-10-CM | POA: Diagnosis not present

## 2024-08-16 ENCOUNTER — Encounter (HOSPITAL_BASED_OUTPATIENT_CLINIC_OR_DEPARTMENT_OTHER): Payer: Self-pay | Admitting: Emergency Medicine

## 2024-08-16 ENCOUNTER — Emergency Department (HOSPITAL_BASED_OUTPATIENT_CLINIC_OR_DEPARTMENT_OTHER)

## 2024-08-16 ENCOUNTER — Inpatient Hospital Stay (HOSPITAL_BASED_OUTPATIENT_CLINIC_OR_DEPARTMENT_OTHER)
Admission: EM | Admit: 2024-08-16 | Discharge: 2024-08-21 | DRG: 375 | Disposition: A | Discharge status: Home / Self Care | Attending: Family Medicine | Admitting: Family Medicine

## 2024-08-16 DIAGNOSIS — K5669 Other partial intestinal obstruction: Secondary | ICD-10-CM | POA: Diagnosis present

## 2024-08-16 DIAGNOSIS — Z79899 Other long term (current) drug therapy: Secondary | ICD-10-CM

## 2024-08-16 DIAGNOSIS — I10 Essential (primary) hypertension: Secondary | ICD-10-CM | POA: Diagnosis present

## 2024-08-16 DIAGNOSIS — E871 Hypo-osmolality and hyponatremia: Secondary | ICD-10-CM | POA: Diagnosis present

## 2024-08-16 DIAGNOSIS — R1909 Other intra-abdominal and pelvic swelling, mass and lump: Secondary | ICD-10-CM | POA: Diagnosis present

## 2024-08-16 DIAGNOSIS — E861 Hypovolemia: Secondary | ICD-10-CM | POA: Diagnosis present

## 2024-08-16 DIAGNOSIS — N1330 Unspecified hydronephrosis: Secondary | ICD-10-CM | POA: Diagnosis present

## 2024-08-16 DIAGNOSIS — K56600 Partial intestinal obstruction, unspecified as to cause: Secondary | ICD-10-CM | POA: Diagnosis present

## 2024-08-16 DIAGNOSIS — I2510 Atherosclerotic heart disease of native coronary artery without angina pectoris: Secondary | ICD-10-CM | POA: Diagnosis present

## 2024-08-16 DIAGNOSIS — E876 Hypokalemia: Secondary | ICD-10-CM | POA: Diagnosis not present

## 2024-08-16 DIAGNOSIS — Z85048 Personal history of other malignant neoplasm of rectum, rectosigmoid junction, and anus: Secondary | ICD-10-CM

## 2024-08-16 DIAGNOSIS — Z9049 Acquired absence of other specified parts of digestive tract: Secondary | ICD-10-CM

## 2024-08-16 DIAGNOSIS — N179 Acute kidney failure, unspecified: Secondary | ICD-10-CM | POA: Diagnosis present

## 2024-08-16 DIAGNOSIS — I708 Atherosclerosis of other arteries: Secondary | ICD-10-CM | POA: Diagnosis present

## 2024-08-16 DIAGNOSIS — Z885 Allergy status to narcotic agent status: Secondary | ICD-10-CM

## 2024-08-16 DIAGNOSIS — E860 Dehydration: Secondary | ICD-10-CM | POA: Diagnosis present

## 2024-08-16 DIAGNOSIS — K56699 Other intestinal obstruction unspecified as to partial versus complete obstruction: Secondary | ICD-10-CM | POA: Diagnosis present

## 2024-08-16 DIAGNOSIS — R1900 Intra-abdominal and pelvic swelling, mass and lump, unspecified site: Secondary | ICD-10-CM

## 2024-08-16 DIAGNOSIS — K640 First degree hemorrhoids: Secondary | ICD-10-CM | POA: Diagnosis present

## 2024-08-16 DIAGNOSIS — K56609 Unspecified intestinal obstruction, unspecified as to partial versus complete obstruction: Principal | ICD-10-CM | POA: Diagnosis present

## 2024-08-16 DIAGNOSIS — C19 Malignant neoplasm of rectosigmoid junction: Principal | ICD-10-CM | POA: Diagnosis present

## 2024-08-16 LAB — COMPREHENSIVE METABOLIC PANEL WITH GFR
ALT: 9 U/L (ref 0–44)
AST: 25 U/L (ref 15–41)
Albumin: 4.8 g/dL (ref 3.5–5.0)
Alkaline Phosphatase: 100 U/L (ref 38–126)
Anion gap: 18 — ABNORMAL HIGH (ref 5–15)
BUN: 48 mg/dL — ABNORMAL HIGH (ref 8–23)
CO2: 31 mmol/L (ref 22–32)
Calcium: 10.5 mg/dL — ABNORMAL HIGH (ref 8.9–10.3)
Chloride: 81 mmol/L — ABNORMAL LOW (ref 98–111)
Creatinine, Ser: 1.81 mg/dL — ABNORMAL HIGH (ref 0.44–1.00)
GFR, Estimated: 26 mL/min — ABNORMAL LOW
Glucose, Bld: 139 mg/dL — ABNORMAL HIGH (ref 70–99)
Potassium: 3.8 mmol/L (ref 3.5–5.1)
Sodium: 130 mmol/L — ABNORMAL LOW (ref 135–145)
Total Bilirubin: 0.8 mg/dL (ref 0.0–1.2)
Total Protein: 8.1 g/dL (ref 6.5–8.1)

## 2024-08-16 LAB — CBC
HCT: 50.2 % — ABNORMAL HIGH (ref 36.0–46.0)
Hemoglobin: 17.5 g/dL — ABNORMAL HIGH (ref 12.0–15.0)
MCH: 31.4 pg (ref 26.0–34.0)
MCHC: 34.9 g/dL (ref 30.0–36.0)
MCV: 90 fL (ref 80.0–100.0)
Platelets: 364 K/uL (ref 150–400)
RBC: 5.58 MIL/uL — ABNORMAL HIGH (ref 3.87–5.11)
RDW: 12.9 % (ref 11.5–15.5)
WBC: 8.5 K/uL (ref 4.0–10.5)
nRBC: 0 % (ref 0.0–0.2)

## 2024-08-16 LAB — LIPASE, BLOOD: Lipase: 96 U/L — ABNORMAL HIGH (ref 11–51)

## 2024-08-16 MED ORDER — SODIUM CHLORIDE 0.9 % IV BOLUS
1000.0000 mL | Freq: Once | INTRAVENOUS | Status: AC
  Administered 2024-08-16: 1000 mL via INTRAVENOUS

## 2024-08-16 MED ORDER — ONDANSETRON HCL 4 MG/2ML IJ SOLN
4.0000 mg | Freq: Once | INTRAMUSCULAR | Status: AC
  Administered 2024-08-16: 4 mg via INTRAVENOUS
  Filled 2024-08-16: qty 2

## 2024-08-16 MED ORDER — LACTATED RINGERS IV SOLN: INTRAVENOUS | Status: DC

## 2024-08-16 MED ORDER — DEXTROSE IN LACTATED RINGERS 5 % IV SOLN: INTRAVENOUS | Status: AC

## 2024-08-16 MED ORDER — HYDRALAZINE HCL 20 MG/ML IJ SOLN
10.0000 mg | Freq: Three times a day (TID) | INTRAMUSCULAR | Status: DC | PRN
  Administered 2024-08-17 – 2024-08-21 (×2): 10 mg via INTRAVENOUS
  Filled 2024-08-16 (×2): qty 1

## 2024-08-16 NOTE — ED Notes (Signed)
 In resp/ back waiting area

## 2024-08-16 NOTE — ED Triage Notes (Signed)
 Seen at [facility] for abdo pain Sent for eval and CT Started 1 month ago Little bit of pain and it moves around

## 2024-08-16 NOTE — ED Notes (Signed)
 Patient notified of need for urine sample to complete eval/assessment. Specimen cup provided and instructions for clean catch given. Patient will notify staff when able to provide

## 2024-08-16 NOTE — Plan of Care (Addendum)
 [facility] emergency department to [facility] transfer telemetry unit transfer:   89 year old female past medical history of adenocarcinoma of the colon status post resection, essential hypertension and generalized anxiety disorder presented to emergency department complaining of abdominal pain ongoing for 1 month and now the abdomen is more distended associated vomiting with poor oral intake and decreased appetite.  At presentation to ED patient found borderline hypertensive otherwise hemodynamically stable.  CT abdomen pelvis showing small bowel obstruction. 1. Findings compatible with mid to distal small bowel obstruction. 2. Enlarging presacral soft tissue mass is concerning for tumor recurrence and may be the etiology of the small bowel obstruction and right hydronephrosis. 3. Postoperative changes in the rectosigmoid colon with fullness noted in the colon adjacent to the suture line cannot exclude tumor recurrence. 4. Diffuse coronary artery and aortoiliac atherosclerosis.  Lab work, CBC unremarkable. CMP showing low sodium 130, elevated creatinine 1.81, elevated calcium 10.5 and elevated anion gap 18.  Normal hepatic function panel.  Elevated lipase level 96.  Pending UA.  EDP Dr. Patsey spoke with general surgery Dr. Vernetta recommended NG tube placement and general surgery team will evaluate patient in the daytime.  Hospitalist consulted for further evaluation management of small bowel obstruction.  And AKI.   [facility] will assume care on arrival to accepting facility. Until arrival, care as per EDP. However, [facility] available 24/7 for questions and assistance. Check [email or web address] for on-call coverage. Nursing staff, please call [facility] Admits & Consults System-Wide number under Amion on patient's arrival so appropriate admitting provider can evaluate the pt.   Author: Evolette Pendell, MD  [facility]

## 2024-08-16 NOTE — ED Provider Notes (Signed)
 " [place] [facility] had been back and then they left a couple [facility] DEPARTMENT AT [facility] Provider Note   CSN: [id number] Arrival date & time: 08/16/24  1537     Patient presents with: Abdominal Pain   Tracy Blackwell is a 89 y.o. female.    Abdominal Pain Patient has abdominal pain.  Has been for around a month.  Abdomen has been more distended.  Has had vomiting.  Decreased oral intake.  Decreased appetite.  Has had previous colon surgeries for cancer.    Past Medical History:  Diagnosis Date   Cancer [facility])    rectosigmoid cancer   Hypertension    Pneumonia    age 52 months old   Past Surgical History:  Procedure Laterality Date   CATARACT EXTRACTION, BILATERAL     COLONOSCOPY     CYSTOSCOPY WITH STENT PLACEMENT Bilateral 08/14/2017   Procedure: CYSTOSCOPY WITH BILATERAL URETERAL CATHETER PLACEMENT;  Surgeon: Devere Lonni Righter, MD;  [facility]: [facility];  Service: Urology;  Laterality: Bilateral;   FLEXIBLE SIGMOIDOSCOPY N/A 08/14/2017   Procedure: FLEXIBLE SIGMOIDOSCOPY;  Surgeon: Teresa Lonni CHRISTELLA, MD;  [facility];  Service: General;  Laterality: N/A;   FLEXIBLE SIGMOIDOSCOPY N/A 07/29/2022   Procedure: FLEXIBLE SIGMOIDOSCOPY;  Surgeon: Teresa Lonni CHRISTELLA, MD;  [facility];  Service: General;  Laterality: N/A;   LAPAROSCOPIC LOW ANTERIOR RESECTION N/A 08/14/2017   Procedure: LAPAROSCOPIC ASSISTED ANTERIOR RESECTION;  Surgeon: Teresa Lonni CHRISTELLA, MD;  [facility];  Service: General;  Laterality: N/A;  ERAS PATHWAY   OSTOMY N/A 07/29/2022   Procedure: POSSIBLE OSTOMY;  Surgeon: Teresa Lonni CHRISTELLA, MD;  [facility];  Service: General;  Laterality: N/A;   UMBILICAL HERNIA REPAIR N/A 08/14/2017   Procedure: UMBILICAL HERNIA REPAIR;  Surgeon: Teresa Lonni CHRISTELLA, MD;  [facility];  Service: General;  Laterality: N/A;   XI ROBOTIC ASSISTED LOWER ANTERIOR RESECTION N/A 07/29/2022   Procedure: XI ROBOTIC ASSISTED REDO LOWER  ANTERIOR RESECTION;  Surgeon: Teresa Lonni CHRISTELLA, MD;  [facility];  Service: General;  Laterality: N/A;     Prior to Admission medications  Medication Sig Start Date End Date Taking? Authorizing Provider  amLODipine  (NORVASC ) 5 MG tablet Take 5 mg by mouth daily. Patient not taking: Reported on 03/30/2023    [provider]  LORazepam  (ATIVAN ) 0.5 MG tablet Take 0.5 mg by mouth as needed for anxiety. Patient not taking: Reported on 03/30/2023 07/11/22   [provider]  losartan -hydrochlorothiazide  (HYZAAR) 100-12.5 MG per tablet Take 1 tablet by mouth daily.  Patient not taking: Reported on 03/30/2023 12/05/13   [provider]  metoprolol  succinate (TOPROL -XL) 100 MG 24 hr tablet Take 100 mg by mouth daily.  Patient not taking: Reported on 03/30/2023 08/30/13   [provider]  Multiple Vitamin (MULTIVITAMIN WITH MINERALS) TABS tablet Take 1 tablet by mouth daily.    [provider]  Multiple Vitamins-Minerals (ZINC PO) Take 1 tablet by mouth daily.    [provider]  MYRBETRIQ 50 MG TB24 tablet Take 50 mg by mouth daily. Patient not taking: Reported on 03/30/2023    [provider]    Allergies: Wasp venom and Codeine    Review of Systems  Gastrointestinal:  Positive for abdominal pain.    Updated Vital Signs BP (!) 201/117 (BP Location: Right Wrist)   Pulse 87   Temp (!) 97.4 F (36.3 C) (Oral)   Resp 16   SpO2 94%   Physical Exam  Vitals and nursing note reviewed.  Cardiovascular:     Rate and Rhythm: Normal rate.  Abdominal:     Tenderness: There is abdominal tenderness.     Hernia: No hernia is present.     Comments: Diffuse tenderness with some distention.  Neurological:     Mental Status: She is alert.     (all labs ordered are listed, but only abnormal results are displayed) Labs Reviewed  LIPASE, BLOOD - Abnormal; Notable for the following components:      Result Value   Lipase 96 (*)    All  other components within normal limits  COMPREHENSIVE METABOLIC PANEL WITH GFR - Abnormal; Notable for the following components:   Sodium 130 (*)    Chloride 81 (*)    Glucose, Bld 139 (*)    BUN 48 (*)    Creatinine, Ser 1.81 (*)    Calcium 10.5 (*)    GFR, Estimated 26 (*)    Anion gap 18 (*)    All other components within normal limits  CBC - Abnormal; Notable for the following components:   RBC 5.58 (*)    Hemoglobin 17.5 (*)    HCT 50.2 (*)    All other components within normal limits  URINALYSIS, ROUTINE W REFLEX MICROSCOPIC    EKG: None  Radiology: CT ABDOMEN PELVIS WO CONTRAST Result Date: 08/16/2024 EXAM: CT ABDOMEN AND PELVIS WITHOUT CONTRAST 08/16/2024 09:59:37 PM TECHNIQUE: CT of the abdomen and pelvis was performed without the administration of intravenous contrast. Multiplanar reformatted images are provided for review. Automated exposure control, iterative reconstruction, and/or weight-based adjustment of the mA/kV was utilized to reduce the radiation dose to as low as reasonably achievable. COMPARISON: Prior study for comparison. CLINICAL HISTORY: Bowel obstruction suspected. FINDINGS: LOWER CHEST: Calcified visualized coronary arteries. LIVER: The liver is unremarkable. GALLBLADDER AND BILE DUCTS: Gallbladder is unremarkable. No biliary ductal dilatation. SPLEEN: No acute abnormality. PANCREAS: No acute abnormality. ADRENAL GLANDS: No acute abnormality. KIDNEYS, URETERS AND BLADDER: Multiple bilateral renal cysts are stable. Per consensus, no follow-up is needed for simple Bosniak type 1 and 2 renal cysts, unless the patient has a malignancy history or risk factors. Mild right hydronephrosis, new since prior study. This may be related to the abnormal presacral soft tissue mass. No visible stones. No perinephric or periureteral stranding. Urinary bladder is unremarkable. GI AND BOWEL: Dilated, fluid-filled stomach and proximal to mid small bowel loops are dilated into the  pelvis. Distal small bowel is decompressed. Findings are compatible with mid to distal small bowel obstruction. There are small bowel loops in the region of the presacral mass; this could be the cause of bowel obstruction. Postoperative changes in the rectosigmoid colon. Questionable recurrent mass at the suture line. Colonic diverticulosis. Normal appendix. PERITONEUM AND RETROPERITONEUM: Abnormal presacral soft tissue measures 4 x 4 cm on image 59, compared to 2.9 x 2.3 cm previously. Findings are concerning for tumor recurrence. No ascites. No free air. VASCULATURE: Calcified aorta. LYMPH NODES: No lymphadenopathy. REPRODUCTIVE ORGANS: No acute abnormality. BONES AND SOFT TISSUES: No acute osseous abnormality. No focal soft tissue abnormality. IMPRESSION: 1. Findings compatible with mid to distal small bowel obstruction. 2. Enlarging presacral soft tissue mass is concerning for tumor recurrence and may be the etiology of the small bowel obstruction and right hydronephrosis. 3. Postoperative changes in the rectosigmoid colon with fullness noted in the colon adjacent to the suture line cannot exclude tumor recurrence. 4. Diffuse coronary artery and aortoiliac atherosclerosis. Electronically signed by: Franky Crease  MD 08/16/2024 11:06 PM EST RP Workstation: [id number]     Procedures   Medications Ordered in the ED  sodium chloride  0.9 % bolus 1,000 mL (0 mLs Intravenous Stopped 08/16/24 2326)  ondansetron  (ZOFRAN ) injection 4 mg (4 mg Intravenous Given 08/16/24 2203)                                    Medical Decision Making Amount and/or Complexity of Data Reviewed Labs: ordered. Radiology: ordered.  Risk Prescription drug management.   Patient abdominal pain.  Decreased oral intake.  Potential dehydration.  Also previous history of colon surgery.  Potential obstruction considered.  Blood work does show increased creatinine.  However last creatinine we have was from 2 years ago.  Will get CT  scan to further evaluate.  Discussed with patient and her daughter.  CT scan done noncontrast and shows potential recurrence of her colon cancer.  Now with bowel obstruction.  Also small hydronephrosis.   Discussed with Dr. Ann from general surgery at [facility].  We think a be better at [facility] long since it is for the colorectal surgeons primarily practice.  NG tube will be placed.  Will discuss with hospitalist for admission.      Final diagnoses:  Small bowel obstruction [facility])  Pelvic mass    ED Discharge Orders     None          Patsey Lot, MD 08/16/24 2329  "

## 2024-08-17 ENCOUNTER — Encounter (HOSPITAL_COMMUNITY): Payer: Self-pay | Admitting: Internal Medicine

## 2024-08-17 ENCOUNTER — Emergency Department (HOSPITAL_BASED_OUTPATIENT_CLINIC_OR_DEPARTMENT_OTHER)

## 2024-08-17 DIAGNOSIS — Z9049 Acquired absence of other specified parts of digestive tract: Secondary | ICD-10-CM | POA: Diagnosis not present

## 2024-08-17 DIAGNOSIS — N1330 Unspecified hydronephrosis: Secondary | ICD-10-CM | POA: Diagnosis present

## 2024-08-17 DIAGNOSIS — I2510 Atherosclerotic heart disease of native coronary artery without angina pectoris: Secondary | ICD-10-CM | POA: Diagnosis present

## 2024-08-17 DIAGNOSIS — K5669 Other partial intestinal obstruction: Secondary | ICD-10-CM | POA: Diagnosis present

## 2024-08-17 DIAGNOSIS — E860 Dehydration: Secondary | ICD-10-CM | POA: Diagnosis present

## 2024-08-17 DIAGNOSIS — Z79899 Other long term (current) drug therapy: Secondary | ICD-10-CM | POA: Diagnosis not present

## 2024-08-17 DIAGNOSIS — R109 Unspecified abdominal pain: Secondary | ICD-10-CM | POA: Diagnosis present

## 2024-08-17 DIAGNOSIS — I708 Atherosclerosis of other arteries: Secondary | ICD-10-CM | POA: Diagnosis present

## 2024-08-17 DIAGNOSIS — C19 Malignant neoplasm of rectosigmoid junction: Secondary | ICD-10-CM | POA: Diagnosis present

## 2024-08-17 DIAGNOSIS — K56600 Partial intestinal obstruction, unspecified as to cause: Secondary | ICD-10-CM | POA: Diagnosis present

## 2024-08-17 DIAGNOSIS — I10 Essential (primary) hypertension: Secondary | ICD-10-CM | POA: Diagnosis present

## 2024-08-17 DIAGNOSIS — Z885 Allergy status to narcotic agent status: Secondary | ICD-10-CM | POA: Diagnosis not present

## 2024-08-17 DIAGNOSIS — E871 Hypo-osmolality and hyponatremia: Secondary | ICD-10-CM | POA: Diagnosis present

## 2024-08-17 DIAGNOSIS — E861 Hypovolemia: Secondary | ICD-10-CM | POA: Diagnosis present

## 2024-08-17 DIAGNOSIS — Z85048 Personal history of other malignant neoplasm of rectum, rectosigmoid junction, and anus: Secondary | ICD-10-CM | POA: Diagnosis not present

## 2024-08-17 DIAGNOSIS — R1909 Other intra-abdominal and pelvic swelling, mass and lump: Secondary | ICD-10-CM | POA: Diagnosis present

## 2024-08-17 DIAGNOSIS — K56609 Unspecified intestinal obstruction, unspecified as to partial versus complete obstruction: Secondary | ICD-10-CM | POA: Diagnosis not present

## 2024-08-17 DIAGNOSIS — K640 First degree hemorrhoids: Secondary | ICD-10-CM | POA: Diagnosis present

## 2024-08-17 DIAGNOSIS — K56699 Other intestinal obstruction unspecified as to partial versus complete obstruction: Secondary | ICD-10-CM | POA: Diagnosis present

## 2024-08-17 DIAGNOSIS — N179 Acute kidney failure, unspecified: Secondary | ICD-10-CM | POA: Diagnosis present

## 2024-08-17 DIAGNOSIS — E876 Hypokalemia: Secondary | ICD-10-CM | POA: Diagnosis not present

## 2024-08-17 LAB — CBC
HCT: 47.5 % — ABNORMAL HIGH (ref 36.0–46.0)
Hemoglobin: 16.2 g/dL — ABNORMAL HIGH (ref 12.0–15.0)
MCH: 30.9 pg (ref 26.0–34.0)
MCHC: 34.1 g/dL (ref 30.0–36.0)
MCV: 90.5 fL (ref 80.0–100.0)
Platelets: 309 K/uL (ref 150–400)
RBC: 5.25 MIL/uL — ABNORMAL HIGH (ref 3.87–5.11)
RDW: 12.8 % (ref 11.5–15.5)
WBC: 7.9 K/uL (ref 4.0–10.5)
nRBC: 0 % (ref 0.0–0.2)

## 2024-08-17 LAB — BASIC METABOLIC PANEL WITH GFR
Anion gap: 14 (ref 5–15)
BUN: 47 mg/dL — ABNORMAL HIGH (ref 8–23)
CO2: 35 mmol/L — ABNORMAL HIGH (ref 22–32)
Calcium: 9.4 mg/dL (ref 8.9–10.3)
Chloride: 85 mmol/L — ABNORMAL LOW (ref 98–111)
Creatinine, Ser: 1.73 mg/dL — ABNORMAL HIGH (ref 0.44–1.00)
GFR, Estimated: 28 mL/min — ABNORMAL LOW
Glucose, Bld: 98 mg/dL (ref 70–99)
Potassium: 2.8 mmol/L — ABNORMAL LOW (ref 3.5–5.1)
Sodium: 134 mmol/L — ABNORMAL LOW (ref 135–145)

## 2024-08-17 LAB — PROTIME-INR
INR: 0.9 (ref 0.8–1.2)
Prothrombin Time: 12.7 s (ref 11.4–15.2)

## 2024-08-17 MED ORDER — ONDANSETRON HCL 4 MG PO TABS: 4.0000 mg | ORAL_TABLET | Freq: Four times a day (QID) | ORAL | Status: DC | PRN

## 2024-08-17 MED ORDER — DIATRIZOATE MEGLUMINE & SODIUM 66-10 % PO SOLN: 90.0000 mL | Freq: Once | ORAL | Status: DC

## 2024-08-17 MED ORDER — ENOXAPARIN SODIUM 30 MG/0.3ML IJ SOSY
30.0000 mg | PREFILLED_SYRINGE | INTRAMUSCULAR | Status: DC
  Administered 2024-08-17 – 2024-08-19 (×3): 30 mg via SUBCUTANEOUS
  Filled 2024-08-17 (×3): qty 0.3

## 2024-08-17 MED ORDER — FLEET ENEMA RE ENEM: 1.0000 | ENEMA | Freq: Once | RECTAL | Status: DC

## 2024-08-17 MED ORDER — ACETAMINOPHEN 650 MG RE SUPP: 650.0000 mg | Freq: Four times a day (QID) | RECTAL | Status: DC | PRN

## 2024-08-17 MED ORDER — FLEET ENEMA RE ENEM
1.0000 | ENEMA | Freq: Once | RECTAL | Status: AC
  Administered 2024-08-18: 1 via RECTAL
  Filled 2024-08-17: qty 1

## 2024-08-17 MED ORDER — ONDANSETRON HCL 4 MG/2ML IJ SOLN: 4.0000 mg | Freq: Four times a day (QID) | INTRAMUSCULAR | Status: DC | PRN

## 2024-08-17 MED ORDER — FLEET ENEMA RE ENEM
1.0000 | ENEMA | Freq: Once | RECTAL | Status: AC
  Administered 2024-08-17: 1 via RECTAL
  Filled 2024-08-17: qty 1

## 2024-08-17 MED ORDER — ACETAMINOPHEN 325 MG PO TABS: 650.0000 mg | ORAL_TABLET | Freq: Four times a day (QID) | ORAL | Status: DC | PRN

## 2024-08-17 NOTE — ED Notes (Signed)
 800 mL emptied from NG suction container.

## 2024-08-17 NOTE — Plan of Care (Signed)
" °  Problem: Education: Goal: Knowledge of General Education information will improve Description: Including pain rating scale, medication(s)/side effects and non-pharmacologic comfort measures Outcome: Progressing   Problem: Health Behavior/Discharge Planning: Goal: Ability to manage health-related needs will improve Outcome: Progressing   Problem: Clinical Measurements: Goal: Will remain free from infection Outcome: Progressing Goal: Cardiovascular complication will be avoided Outcome: Progressing   Problem: Activity: Goal: Risk for activity intolerance will decrease Outcome: Progressing   Problem: Coping: Goal: Level of anxiety will decrease Outcome: Progressing   Problem: Elimination: Goal: Will not experience complications related to urinary retention Outcome: Progressing   Problem: Pain Managment: Goal: General experience of comfort will improve and/or be controlled Outcome: Progressing   Problem: Safety: Goal: Ability to remain free from injury will improve Outcome: Progressing   "

## 2024-08-17 NOTE — H&P (Signed)
 " History and Physical    Tracy Blackwell FMW:[id number] DOB: May 28, 1934 DOA: 08/16/2024  PCP: Chrystal Lamarr RAMAN, MD   Chief Complaint: Abdominal pain  HPI: Tracy Blackwell is a 89 y.o. female with medical history significant of rectal cancer, hypertension who presents emergency department with nausea vomiting decreased appetite.  Patient has a prior history of colon resection.  She presented to ER where she was found to be afebrile and hemodynamically stable.  Labs were obtained which showed lipase 96, creatinine 1.8 baseline around 1, sodium 130, hemoglobin 17.5, x-ray abdomen showed NG tube in place.  CT abdomen showed distal small bowel obstruction with enlarging presacral soft tissue mass.  Surgery was consulted recommended mission to [facility] with plans for consultation in the morning.   Review of Systems: Review of Systems  Constitutional: Negative.   HENT: Negative.    Eyes: Negative.   Respiratory: Negative.    Cardiovascular: Negative.   Gastrointestinal:  Positive for abdominal pain, nausea and vomiting.  Genitourinary: Negative.   Musculoskeletal: Negative.   Skin: Negative.   Neurological: Negative.   Endo/Heme/Allergies: Negative.   Psychiatric/Behavioral: Negative.    All other systems reviewed and are negative.    As per HPI otherwise 10 point review of systems negative.   Allergies[1]  Past Medical History:  Diagnosis Date   Cancer [facility])    rectosigmoid cancer   Hypertension    Pneumonia    age 23 months old    Past Surgical History:  Procedure Laterality Date   CATARACT EXTRACTION, BILATERAL     COLONOSCOPY     CYSTOSCOPY WITH STENT PLACEMENT Bilateral 08/14/2017   Procedure: CYSTOSCOPY WITH BILATERAL URETERAL CATHETER PLACEMENT;  Surgeon: Devere Lonni Righter, MD;  [facility];  Service: Urology;  Laterality: Bilateral;   FLEXIBLE SIGMOIDOSCOPY N/A 08/14/2017   Procedure: FLEXIBLE SIGMOIDOSCOPY;  Surgeon: Teresa Lonni CHRISTELLA, MD;   [facility];  Service: General;  Laterality: N/A;   FLEXIBLE SIGMOIDOSCOPY N/A 07/29/2022   Procedure: FLEXIBLE SIGMOIDOSCOPY;  Surgeon: Teresa Lonni CHRISTELLA, MD;  [facility];  Service: General;  Laterality: N/A;   LAPAROSCOPIC LOW ANTERIOR RESECTION N/A 08/14/2017   Procedure: LAPAROSCOPIC ASSISTED ANTERIOR RESECTION;  Surgeon: Teresa Lonni CHRISTELLA, MD;  [facility];  Service: General;  Laterality: N/A;  ERAS PATHWAY   OSTOMY N/A 07/29/2022   Procedure: POSSIBLE OSTOMY;  Surgeon: Teresa Lonni CHRISTELLA, MD;  [facility];  Service: General;  Laterality: N/A;   UMBILICAL HERNIA REPAIR N/A 08/14/2017   Procedure: UMBILICAL HERNIA REPAIR;  Surgeon: Teresa Lonni CHRISTELLA, MD;  [facility];  Service: General;  Laterality: N/A;   XI ROBOTIC ASSISTED LOWER ANTERIOR RESECTION N/A 07/29/2022   Procedure: XI ROBOTIC ASSISTED REDO LOWER ANTERIOR RESECTION;  Surgeon: Teresa Lonni CHRISTELLA, MD;  [facility];  Service: General;  Laterality: N/A;     reports that she has never smoked. She has never used smokeless tobacco. She reports current alcohol use. She reports that she does not use drugs.  History reviewed. No pertinent family history.  Prior to Admission medications  Medication Sig Start Date End Date Taking? Authorizing Provider  amLODipine  (NORVASC ) 5 MG tablet Take 5 mg by mouth daily. Patient not taking: Reported on 03/30/2023    [provider]  LORazepam  (ATIVAN ) 0.5 MG tablet Take 0.5 mg by mouth as needed for anxiety. Patient not taking: Reported on 03/30/2023 07/11/22   [provider]  losartan -hydrochlorothiazide  (HYZAAR) 100-12.5 MG per tablet Take 1 tablet by mouth  daily.  Patient not taking: Reported on 03/30/2023 12/05/13   [provider]  metoprolol  succinate (TOPROL -XL) 100 MG 24 hr tablet Take 100 mg by mouth daily.  Patient not taking: Reported on 03/30/2023 08/30/13   [provider]  Multiple Vitamin (MULTIVITAMIN WITH  MINERALS) TABS tablet Take 1 tablet by mouth daily.    [provider]  Multiple Vitamins-Minerals (ZINC PO) Take 1 tablet by mouth daily.    [provider]  MYRBETRIQ 50 MG TB24 tablet Take 50 mg by mouth daily. Patient not taking: Reported on 03/30/2023    [provider]    Physical Exam: Vitals:   08/16/24 2118 08/17/24 0002 08/17/24 0100 08/17/24 0152  BP: (!) 201/117 (!) 193/108 (!) 150/85 (!) 141/88  Pulse: 87  61 86  Resp: 16  18 16   Temp: (!) 97.4 F (36.3 C)  98 F (36.7 C) (!) 97.5 F (36.4 C)  TempSrc: Oral  Oral   SpO2: 94%  93% 96%   Physical Exam Constitutional:      Appearance: She is normal weight.  HENT:     Head: Normocephalic.     Mouth/Throat:     Mouth: Mucous membranes are moist.  Eyes:     Extraocular Movements: Extraocular movements intact.  Cardiovascular:     Rate and Rhythm: Normal rate and regular rhythm.     Heart sounds: Normal heart sounds.  Pulmonary:     Effort: Pulmonary effort is normal.  Abdominal:     General: Abdomen is flat. Bowel sounds are absent.     Palpations: Abdomen is soft.  Skin:    General: Skin is warm.     Capillary Refill: Capillary refill takes less than 2 seconds.  Neurological:     General: No focal deficit present.     Mental Status: She is alert. She is disoriented.  Psychiatric:        Mood and Affect: Mood normal.        Labs on Admission: I have personally reviewed the patients's labs and imaging studies.  Assessment/Plan Principal Problem:   Small bowel obstruction (HCC) Active Problems:   Partial small bowel obstruction (HCC)   # Small bowel obstruction - Patient has history of colon resection - CT imaging with possible recurrence/mid to distal small bowel obstruction  Plan: Continue NG tube General Surgery consulted N.p.o.  # AKI-likely due to dehydration.  Will place on IV fluids  # Hypovolemic hyponatremia-continue IV fluids  # Hypertension-patient is not  on home antihypertensives.  Will continue as needed hydralazine   Admission status: Inpatient Med-Surg  Certification: The appropriate patient status for this patient is INPATIENT. Inpatient status is judged to be reasonable and necessary in order to provide the required intensity of service to ensure the patient's safety. The patient's presenting symptoms, physical exam findings, and initial radiographic and laboratory data in the context of their chronic comorbidities is felt to place them at high risk for further clinical deterioration. Furthermore, it is not anticipated that the patient will be medically stable for discharge from the hospital within 2 midnights of admission.   * I certify that at the point of admission it is my clinical judgment that the patient will require inpatient hospital care spanning beyond 2 midnights from the point of admission due to high intensity of service, high risk for further deterioration and high frequency of surveillance required.DEWAINE Lamar Dess MD [facility] If 7PM-7AM, please contact night-coverage [email or web address]  08/17/2024, 3:19 AM        [  1]  Allergies Allergen Reactions   Wasp Venom Anaphylaxis   Codeine Nausea And Vomiting   "

## 2024-08-17 NOTE — Consult Note (Signed)
 Referring Provider: Dr. Dena Primary Care Physician:  Chrystal Lamarr RAMAN, MD Primary Gastroenterologist:  Dr. Saintclair  Reason for Consultation:  History of colon cancer; Small bowel obstruction; Abnormal imaging  HPI: Tracy Blackwell is a 89 y.o. female with history of colon cancer (dx 2018) followed by laparoscopic anterior resection.  Colonoscopy in March 2019 that did not show recurrence and recommendation was to repeat in 1 year but patient did not follow-up as recommended.  Sigmoidoscopy in 2023 showed a large colon mass 10 cm from the anus.  Low anterior resection done in December 2023 with no lymph nodes positive at that time.  She has been having 2 weeks of difficulty moving her bowels.  She has had several days of nausea, vomiting, and abdominal pain.  Denies rectal bleeding or black stools. CT shows mid to distal small bowel obstruction and an enlarging presacral soft tissue mass. Also fullness of the anastomosis in the rectosigmoid colon.  Past Medical History:  Diagnosis Date   Cancer [facility])    rectosigmoid cancer   Hypertension    Pneumonia    age 37 months old    Past Surgical History:  Procedure Laterality Date   CATARACT EXTRACTION, BILATERAL     COLONOSCOPY     CYSTOSCOPY WITH STENT PLACEMENT Bilateral 08/14/2017   Procedure: CYSTOSCOPY WITH BILATERAL URETERAL CATHETER PLACEMENT;  Surgeon: Devere Lonni Righter, MD;  [facility];  Service: Urology;  Laterality: Bilateral;   FLEXIBLE SIGMOIDOSCOPY N/A 08/14/2017   Procedure: FLEXIBLE SIGMOIDOSCOPY;  Surgeon: Teresa Lonni HERO, MD;  [facility];  Service: General;  Laterality: N/A;   FLEXIBLE SIGMOIDOSCOPY N/A 07/29/2022   Procedure: FLEXIBLE SIGMOIDOSCOPY;  Surgeon: Teresa Lonni HERO, MD;  [facility];  Service: General;  Laterality: N/A;   LAPAROSCOPIC LOW ANTERIOR RESECTION N/A 08/14/2017   Procedure: LAPAROSCOPIC ASSISTED ANTERIOR RESECTION;  Surgeon: Teresa Lonni HERO, MD;  [facility];  Service: General;  Laterality: N/A;  ERAS PATHWAY   OSTOMY N/A 07/29/2022   Procedure: POSSIBLE OSTOMY;  Surgeon: Teresa Lonni HERO, MD;  [facility];  Service: General;  Laterality: N/A;   UMBILICAL HERNIA REPAIR N/A 08/14/2017   Procedure: UMBILICAL HERNIA REPAIR;  Surgeon: Teresa Lonni HERO, MD;  [facility];  Service: General;  Laterality: N/A;   XI ROBOTIC ASSISTED LOWER ANTERIOR RESECTION N/A 07/29/2022   Procedure: XI ROBOTIC ASSISTED REDO LOWER ANTERIOR RESECTION;  Surgeon: Teresa Lonni HERO, MD;  [facility];  Service: General;  Laterality: N/A;    Prior to Admission medications  Medication Sig Start Date End Date Taking? Authorizing Provider  Multiple Vitamin (MULTIVITAMIN WITH MINERALS) TABS tablet Take 1 tablet by mouth daily.    [provider]  Multiple Vitamins-Minerals (ZINC PO) Take 1 tablet by mouth daily.    [provider]    Scheduled Meds:  diatrizoate  meglumine -sodium  90 mL Per NG tube Once   enoxaparin  (LOVENOX ) injection  30 mg Subcutaneous Q24H   sodium phosphate   1 enema Rectal Once   Followed by   [START ON 08/18/2024] sodium phosphate   1 enema Rectal Once   Continuous Infusions:  dextrose  5% lactated ringers  Stopped (08/17/24 1154)   PRN Meds:.acetaminophen  **OR** acetaminophen , hydrALAZINE , ondansetron  **OR** ondansetron  (ZOFRAN ) IV  Allergies as of 08/16/2024 - Reviewed 08/16/2024  Allergen Reaction Noted   Wasp venom Anaphylaxis 12/07/2013   Codeine Nausea And Vomiting 07/28/2017    History reviewed. No pertinent family history.  Social History   Socioeconomic History   Marital  status: Widowed    Spouse name: Not on file   Number of children: Not on file   Years of education: Not on file   Highest education level: Not on file  Occupational History   Not on file  Tobacco Use   Smoking status: Never   Smokeless tobacco: Never  Vaping Use   Vaping status: Never Used  Substance and Sexual  Activity   Alcohol use: Yes    Comment: socially   Drug use: No   Sexual activity: Not on file  Other Topics Concern   Not on file  Social History Narrative   Not on file   Social Drivers of Health   Tobacco Use: Low Risk (08/16/2024)   Patient History    Smoking Tobacco Use: Never    Smokeless Tobacco Use: Never    Passive Exposure: Not on file  Financial Resource Strain: Not on file  Food Insecurity: No Food Insecurity (08/17/2024)   Epic    Worried About Programme Researcher, Broadcasting/film/video in the Last Year: Never true    Ran Out of Food in the Last Year: Never true  Transportation Needs: No Transportation Needs (08/17/2024)   Epic    Lack of Transportation (Medical): No    Lack of Transportation (Non-Medical): No  Physical Activity: Not on file  Stress: Not on file  Social Connections: Moderately Isolated (08/17/2024)   Social Connection and Isolation Panel    Frequency of Communication with Friends and Family: More than three times a week    Frequency of Social Gatherings with Friends and Family: More than three times a week    Attends Religious Services: More than 4 times per year    Active Member of [employer] or Organizations: No    Attends Banker Meetings: Never    Marital Status: Widowed  Intimate Partner Violence: Not At Risk (08/17/2024)   Epic    Fear of Current or Ex-Partner: No    Emotionally Abused: No    Physically Abused: No    Sexually Abused: No  Depression (PHQ2-9): Not on file  Alcohol Screen: Not on file  Housing: Low Risk (08/17/2024)   Epic    Unable to Pay for Housing in the Last Year: No    Number of Times Moved in the Last Year: 0    Homeless in the Last Year: No  Utilities: Not At Risk (08/17/2024)   Epic    Threatened with loss of utilities: No  Health Literacy: Not on file    Review of Systems: All negative except as stated above in HPI.  Physical Exam: Vital signs: Vitals:   08/17/24 1032 08/17/24 1355  BP: (!) 141/96 (!) 124/97  Pulse: 92 (!)  119  Resp: 18 20  Temp: 98.3 F (36.8 C) 98.3 F (36.8 C)  SpO2: 98% 96%   Last BM Date : 08/15/24 General:   Lethargic, elderly, thin, no acute distress Head: normocephalic, atraumatic Eyes: anicteric sclera ENT: NG in place with dark bilious fluid in tubing Neck: supple, nontender Lungs:  Clear throughout to auscultation.   No wheezes, crackles, or rhonchi. No acute distress. Heart:  Regular rate and rhythm; no murmurs, clicks, rubs,  or gallops. Abdomen: mild distention, nontender, soft  Rectal:  Deferred Ext: no edema  GI:  Lab Results: Recent Labs    08/16/24 1615 08/17/24 0611  WBC 8.5 7.9  HGB 17.5* 16.2*  HCT 50.2* 47.5*  PLT 364 309   BMET Recent Labs  08/16/24 1615 08/17/24 0611  NA 130* 134*  K 3.8 2.8*  CL 81* 85*  CO2 31 35*  GLUCOSE 139* 98  BUN 48* 47*  CREATININE 1.81* 1.73*  CALCIUM 10.5* 9.4   LFT Recent Labs    08/16/24 1615  PROT 8.1  ALBUMIN 4.8  AST 25  ALT 9  ALKPHOS 100  BILITOT 0.8   PT/INR Recent Labs    08/17/24 0611  LABPROT 12.7  INR 0.9       Impression/Plan: History of recurrent colon cancer with SBO concerning for mets and concern for tumor recurrence at colon anastomosis. Will do flex sig tomorrow to evaluate anastomosis for recurrence. If inconclusive, then will need biopsy of presacral mass by IR. Fleets enemas X 2. Keep NPO and NG intermittent suction. Supportive care.    LOS: 0 days   Jerrell JAYSON Sol  08/17/2024, 3:01 PM  Questions please call [phone]

## 2024-08-17 NOTE — Consult Note (Signed)
 " CC: I was throwing up  Reason for consult: Small bowel obstruction, pelvic mass, history of colorectal cancer  Requesting provider: Dr. Alvia  HPI: Tracy Blackwell is an 89 y.o. female who is here for several days of nausea and vomiting and abdominal pain and altered bowel habits.  Patient has a history of recurrent sigmoid colon cancer.  She underwent a laparoscopic low anterior resection on August 14, 2017 by Dr. Teresa.  She was found to have a T3 N0 lesion.  About 5 years later she was found to have local recurrence at her anastomosis in October 2023.  She underwent preoperative workup and evaluation by the multidisciplinary tumor board.  On July 29, 2022 she underwent a robotic low anterior resection and flexible sigmoidoscopy by Dr. Teresa.  She had a 29 mm EEA colorectal anastomosis 10 cm from the anal verge.  She is found to have on that path recurrent moderately differentiated adenocarcinoma extending into the perirectal soft tissue-RP T3 N0.  She last saw Dr. Teresa in July 2024.  She last saw medical oncology in March 2025.  Reportedly she had been declining surveillance colonoscopies.  She had a CT scan in March 2025 that showed no evidence of disease recurrence and at her last visit with medical oncology in March of this year she requested discharge from oncology clinic.  She states that she initially started having some issues with diarrhea and then constipation and then tried to manage it with over-the-counter medications.  Several days ago she started having significant vomiting and felt distended with some abdominal discomfort.  She states that she threw up a lot on Wednesday and Thursday.  She states her last bowel movement was about 3 days ago.  She has had some flatus.  She came to the emergency room and was found to have a pelvic mass in the presacral space which has increased in size and a questionable recurrence of mass at the suture line along with a partial small bowel  obstruction and some mild right hydronephrosis.  She was found to have some mild AKI.  We were asked to see her for evaluation.  She states that her belly feels better.  She is having some flatus but no bowel movement.  She is not sure why she needs a nasogastric tube.  She lives with her son.  She still drives herself around.  She states that she still does her shopping.  She states that she is fairly active.  Past Medical History:  Diagnosis Date   Cancer [facility])    rectosigmoid cancer   Hypertension    Pneumonia    age 36 months old    Past Surgical History:  Procedure Laterality Date   CATARACT EXTRACTION, BILATERAL     COLONOSCOPY     CYSTOSCOPY WITH STENT PLACEMENT Bilateral 08/14/2017   Procedure: CYSTOSCOPY WITH BILATERAL URETERAL CATHETER PLACEMENT;  Surgeon: Devere Lonni Righter, MD;  [facility]: [facility];  Service: Urology;  Laterality: Bilateral;   FLEXIBLE SIGMOIDOSCOPY N/A 08/14/2017   Procedure: FLEXIBLE SIGMOIDOSCOPY;  Surgeon: Teresa Lonni HERO, MD;  [facility];  Service: General;  Laterality: N/A;   FLEXIBLE SIGMOIDOSCOPY N/A 07/29/2022   Procedure: FLEXIBLE SIGMOIDOSCOPY;  Surgeon: Teresa Lonni HERO, MD;  [facility];  Service: General;  Laterality: N/A;   LAPAROSCOPIC LOW ANTERIOR RESECTION N/A 08/14/2017   Procedure: LAPAROSCOPIC ASSISTED ANTERIOR RESECTION;  Surgeon: Teresa Lonni HERO, MD;  [facility];  Service: General;  Laterality: N/A;  ERAS PATHWAY  OSTOMY N/A 07/29/2022   Procedure: POSSIBLE OSTOMY;  Surgeon: Teresa Lonni HERO, MD;  [facility];  Service: General;  Laterality: N/A;   UMBILICAL HERNIA REPAIR N/A 08/14/2017   Procedure: UMBILICAL HERNIA REPAIR;  Surgeon: Teresa Lonni HERO, MD;  [facility];  Service: General;  Laterality: N/A;   XI ROBOTIC ASSISTED LOWER ANTERIOR RESECTION N/A 07/29/2022   Procedure: XI ROBOTIC ASSISTED REDO LOWER ANTERIOR RESECTION;  Surgeon: Teresa Lonni HERO, MD;  [facility];  Service: General;  Laterality: N/A;    History reviewed. No pertinent family history.  Social:  reports that she has never smoked. She has never used smokeless tobacco. She reports current alcohol use. She reports that she does not use drugs.  Allergies: Allergies[1]  Medications: I have reviewed the patient's current medications.   ROS - all of the below systems have been reviewed with the patient and positives are indicated with bold text General: chills, fever or night sweats Eyes: blurry vision or double vision ENT: epistaxis or sore throat Allergy/Immunology: itchy/watery eyes or nasal congestion Hematologic/Lymphatic: bleeding problems, blood clots or swollen lymph nodes Endocrine: temperature intolerance or unexpected weight changes Breast: new or changing breast lumps or nipple discharge Resp: cough, shortness of breath, or wheezing CV: chest pain or dyspnea on exertion GI: as per HPI GU: dysuria, trouble voiding, or hematuria MSK: joint pain or joint stiffness Neuro: TIA or stroke symptoms Derm: pruritus and skin lesion changes Psych: anxiety and depression  PE Blood pressure (!) 141/96, pulse 92, temperature 98.3 F (36.8 C), temperature source Oral, resp. rate 18, height 5' 2 (1.575 m), weight 71.6 kg, SpO2 98%. Constitutional: NAD; conversant; no deformities Eyes: Moist conjunctiva; no lid lag; anicteric; PERRL Neck: Trachea midline; no thyromegaly Lungs: Normal respiratory effort; no tactile fremitus CV: RRR; no palpable thrills; no pitting edema GI: Abd soft, a little bit full.  Nontender.  Old surgical scars.  No guarding or peritonitis; no palpable hepatosplenomegaly MSK:  no clubbing/cyanosis Psychiatric: Appropriate affect; alert and oriented x3 Lymphatic: No palpable cervical or axillary lymphadenopathy Skin: No rash, lesions or jaundice  Results for orders placed or performed during the hospital encounter of 08/16/24 (from the past 48 hours)   Lipase, blood     Status: Abnormal   Collection Time: 08/16/24  4:15 PM  Result Value Ref Range   Lipase 96 (H) 11 - 51 U/L    Comment: Performed at [facility], [address], [place], [place] [address]  Comprehensive metabolic panel     Status: Abnormal   Collection Time: 08/16/24  4:15 PM  Result Value Ref Range   Sodium 130 (L) 135 - 145 mmol/L   Potassium 3.8 3.5 - 5.1 mmol/L   Chloride 81 (L) 98 - 111 mmol/L   CO2 31 22 - 32 mmol/L   Glucose, Bld 139 (H) 70 - 99 mg/dL    Comment: Glucose reference range applies only to samples taken after fasting for at least 8 hours.   BUN 48 (H) 8 - 23 mg/dL   Creatinine, Ser 8.18 (H) 0.44 - 1.00 mg/dL   Calcium 89.4 (H) 8.9 - 10.3 mg/dL   Total Protein 8.1 6.5 - 8.1 g/dL   Albumin 4.8 3.5 - 5.0 g/dL   AST 25 15 - 41 U/L   ALT 9 0 - 44 U/L   Alkaline Phosphatase 100 38 - 126 U/L   Total Bilirubin 0.8 0.0 - 1.2 mg/dL   GFR, Estimated 26 (L) >60 mL/min  Comment: (NOTE) Calculated using the CKD-EPI Creatinine Equation (2021)    Anion gap 18 (H) 5 - 15    Comment: Performed at [facility], [address] [place], [place] [address]  CBC     Status: Abnormal   Collection Time: 08/16/24  4:15 PM  Result Value Ref Range   WBC 8.5 4.0 - 10.5 K/uL   RBC 5.58 (H) 3.87 - 5.11 MIL/uL   Hemoglobin 17.5 (H) 12.0 - 15.0 g/dL   HCT 49.7 (H) 63.9 - 53.9 %   MCV 90.0 80.0 - 100.0 fL   MCH 31.4 26.0 - 34.0 pg   MCHC 34.9 30.0 - 36.0 g/dL   RDW 87.0 88.4 - 84.4 %   Platelets 364 150 - 400 K/uL   nRBC 0.0 0.0 - 0.2 %    Comment: Performed at [facility], [address], [place], [place] [address]  Basic metabolic panel     Status: Abnormal   Collection Time: 08/17/24  6:11 AM  Result Value Ref Range   Sodium 134 (L) 135 - 145 mmol/L   Potassium 2.8 (L) 3.5 - 5.1 mmol/L    Comment: Delta check noted  Result called to, Read back and verified with WRENN, S. RN AT [phone] ON 1.3.26. FA     Chloride 85 (L) 98 - 111 mmol/L   CO2 35 (H) 22 - 32 mmol/L   Glucose, Bld 98 70 - 99 mg/dL    Comment: Glucose reference range applies only to samples taken after fasting for at least 8 hours.   BUN 47 (H) 8 - 23 mg/dL   Creatinine, Ser 8.26 (H) 0.44 - 1.00 mg/dL   Calcium 9.4 8.9 - 89.6 mg/dL   GFR, Estimated 28 (L) >60 mL/min    Comment: (NOTE) Calculated using the CKD-EPI Creatinine Equation (2021)    Anion gap 14 5 - 15    Comment: Performed at [facility], [address], [place], [place] [address]  CBC     Status: Abnormal   Collection Time: 08/17/24  6:11 AM  Result Value Ref Range   WBC 7.9 4.0 - 10.5 K/uL   RBC 5.25 (H) 3.87 - 5.11 MIL/uL   Hemoglobin 16.2 (H) 12.0 - 15.0 g/dL   HCT 52.4 (H) 63.9 - 53.9 %   MCV 90.5 80.0 - 100.0 fL   MCH 30.9 26.0 - 34.0 pg   MCHC 34.1 30.0 - 36.0 g/dL   RDW 87.1 88.4 - 84.4 %   Platelets 309 150 - 400 K/uL   nRBC 0.0 0.0 - 0.2 %    Comment: Performed at [facility], [address] [place], [place] [address]  Protime-INR     Status: None   Collection Time: 08/17/24  6:11 AM  Result Value Ref Range   Prothrombin Time 12.7 11.4 - 15.2 seconds   INR 0.9 0.8 - 1.2    Comment: (NOTE) INR goal varies based on device and disease states. Performed at [facility], [address] [place], [place] [address]     DG Abd 1 View Result Date: 08/17/2024 EXAM: 1 VIEW XRAY OF THE ABDOMEN 08/17/2024 12:29:00 AM COMPARISON: None available. CLINICAL HISTORY: Encounter for nasogastric (NG) tube placement. FINDINGS: LINES, TUBES AND DEVICES: NG tube the tip is in the proximal stomach with the side port near the GE junction. BOWEL: Dilated upper abdomen small bowel loops. SOFT TISSUES: No abnormal calcifications. BONES: No acute fracture. IMPRESSION: 1. Nasogastric tube  tip in the proximal stomach with the side port near the gastroesophageal junction; consider advancement. Electronically signed  by: Franky Crease MD 08/17/2024 12:31 AM EST RP Workstation: [id number]   CT ABDOMEN PELVIS WO CONTRAST Result Date: 08/16/2024 EXAM: CT ABDOMEN AND PELVIS WITHOUT CONTRAST 08/16/2024 09:59:37 PM TECHNIQUE: CT of the abdomen and pelvis was performed without the administration of intravenous contrast. Multiplanar reformatted images are provided for review. Automated exposure control, iterative reconstruction, and/or weight-based adjustment of the mA/kV was utilized to reduce the radiation dose to as low as reasonably achievable. COMPARISON: Prior study for comparison. CLINICAL HISTORY: Bowel obstruction suspected. FINDINGS: LOWER CHEST: Calcified visualized coronary arteries. LIVER: The liver is unremarkable. GALLBLADDER AND BILE DUCTS: Gallbladder is unremarkable. No biliary ductal dilatation. SPLEEN: No acute abnormality. PANCREAS: No acute abnormality. ADRENAL GLANDS: No acute abnormality. KIDNEYS, URETERS AND BLADDER: Multiple bilateral renal cysts are stable. Per consensus, no follow-up is needed for simple Bosniak type 1 and 2 renal cysts, unless the patient has a malignancy history or risk factors. Mild right hydronephrosis, new since prior study. This may be related to the abnormal presacral soft tissue mass. No visible stones. No perinephric or periureteral stranding. Urinary bladder is unremarkable. GI AND BOWEL: Dilated, fluid-filled stomach and proximal to mid small bowel loops are dilated into the pelvis. Distal small bowel is decompressed. Findings are compatible with mid to distal small bowel obstruction. There are small bowel loops in the region of the presacral mass; this could be the cause of bowel obstruction. Postoperative changes in the rectosigmoid colon. Questionable recurrent mass at the suture line. Colonic diverticulosis. Normal appendix. PERITONEUM AND RETROPERITONEUM: Abnormal presacral soft tissue measures 4 x 4 cm on image 59, compared to 2.9 x 2.3 cm previously. Findings are concerning  for tumor recurrence. No ascites. No free air. VASCULATURE: Calcified aorta. LYMPH NODES: No lymphadenopathy. REPRODUCTIVE ORGANS: No acute abnormality. BONES AND SOFT TISSUES: No acute osseous abnormality. No focal soft tissue abnormality. IMPRESSION: 1. Findings compatible with mid to distal small bowel obstruction. 2. Enlarging presacral soft tissue mass is concerning for tumor recurrence and may be the etiology of the small bowel obstruction and right hydronephrosis. 3. Postoperative changes in the rectosigmoid colon with fullness noted in the colon adjacent to the suture line cannot exclude tumor recurrence. 4. Diffuse coronary artery and aortoiliac atherosclerosis. Electronically signed by: Franky Crease MD 08/16/2024 11:06 PM EST RP Workstation: [id number]    Imaging: Personally reviewed images from her CT abdomen pelvis from January 2 as well as her plain film from today  A/P: Tracy Blackwell is an 89 y.o. female with  History of recurrent colorectal cancer Presacral mass Possible tumor recurrence at colorectal anastomosis Small bowel obstruction likely due to presacral mass Mild right hydronephrosis AKI  She does not need urgent laparotomy.  She is resting comfortably.  She is essentially nontender.  No fever, leukocytosis or tachycardia.  There is no signs of ischemic bowel.  Given her clinical history of recurrent colorectal cancer and her current CT findings this is all concerning for recurrent colorectal cancer both at the anastomosis and presacral space.  Check CEA level Agree with small bowel obstruction protocol Will need to get tissue to confirm dx- -Rec consult [facility] to consider flexible sigmoidoscopy -rec Consult medical oncology -If Creatinine worsens, may need urology to weigh in If flex sig not an option or not recommended IR biopsy of the presacral mass could be an option May ultimately need palliative care involvement to help guide discussion  but patient is a high  functioning 89 year old but their involvement may be helpful in figuring out patient's wishes  Data reviewed: I reviewed H&P, ER notes, personally reviewed the imaging of her CT scan of her abdomen and pelvis from yesterday, CT scan from March 2025, Dr. Joleen office note from July 2025, medical oncology note from March 2025  High MDM  Camellia HERO. Tanda, MD, FACS General, Bariatric, & Minimally Invasive Surgery [facility]      [1]  Allergies Allergen Reactions   Wasp Venom Anaphylaxis   Codeine Nausea And Vomiting   "

## 2024-08-17 NOTE — Progress Notes (Signed)
 This 89 year old female was admitted earlier this morning by my partner with partial small bowel obstruction.  She reports passing gas but has not had a bowel movement.  When I saw her she had an NG tube in place but was not attached to suction.  Small bowel protocol initiated.  Surgery was consulted by the ED physician. CT abdomen compatible with mid to distal small bowel obstruction.  Enlarging presacral soft tissue mass concerning for tumor recurrence.  This may be the etiology of the small bowel obstruction and right hydronephrosis.  Postop changes in the rectosigmoid colon with fullness noted in the colon adjacent to the suture line cannot exclude tumor recurrence.  Diffuse coronary artery atherosclerosis. Will place Foley due to right hydronephrosis Small bowel protocol initiated IV fluids while n.p.o.

## 2024-08-17 NOTE — H&P (View-Only) (Signed)
 Referring Provider: Dr. Dena Primary Care Physician:  Chrystal Lamarr RAMAN, MD Primary Gastroenterologist:  Dr. Saintclair  Reason for Consultation:  History of colon cancer; Small bowel obstruction; Abnormal imaging  HPI: Tracy Blackwell is a 89 y.o. female with history of colon cancer (dx 2018) followed by laparoscopic anterior resection.  Colonoscopy in March 2019 that did not show recurrence and recommendation was to repeat in 1 year but patient did not follow-up as recommended.  Sigmoidoscopy in 2023 showed a large colon mass 10 cm from the anus.  Low anterior resection done in December 2023 with no lymph nodes positive at that time.  She has been having 2 weeks of difficulty moving her bowels.  She has had several days of nausea, vomiting, and abdominal pain.  Denies rectal bleeding or black stools. CT shows mid to distal small bowel obstruction and an enlarging presacral soft tissue mass. Also fullness of the anastomosis in the rectosigmoid colon.  Past Medical History:  Diagnosis Date   Cancer [facility])    rectosigmoid cancer   Hypertension    Pneumonia    age 37 months old    Past Surgical History:  Procedure Laterality Date   CATARACT EXTRACTION, BILATERAL     COLONOSCOPY     CYSTOSCOPY WITH STENT PLACEMENT Bilateral 08/14/2017   Procedure: CYSTOSCOPY WITH BILATERAL URETERAL CATHETER PLACEMENT;  Surgeon: Devere Lonni Righter, MD;  [facility];  Service: Urology;  Laterality: Bilateral;   FLEXIBLE SIGMOIDOSCOPY N/A 08/14/2017   Procedure: FLEXIBLE SIGMOIDOSCOPY;  Surgeon: Teresa Lonni HERO, MD;  [facility];  Service: General;  Laterality: N/A;   FLEXIBLE SIGMOIDOSCOPY N/A 07/29/2022   Procedure: FLEXIBLE SIGMOIDOSCOPY;  Surgeon: Teresa Lonni HERO, MD;  [facility];  Service: General;  Laterality: N/A;   LAPAROSCOPIC LOW ANTERIOR RESECTION N/A 08/14/2017   Procedure: LAPAROSCOPIC ASSISTED ANTERIOR RESECTION;  Surgeon: Teresa Lonni HERO, MD;  [facility];  Service: General;  Laterality: N/A;  ERAS PATHWAY   OSTOMY N/A 07/29/2022   Procedure: POSSIBLE OSTOMY;  Surgeon: Teresa Lonni HERO, MD;  [facility];  Service: General;  Laterality: N/A;   UMBILICAL HERNIA REPAIR N/A 08/14/2017   Procedure: UMBILICAL HERNIA REPAIR;  Surgeon: Teresa Lonni HERO, MD;  [facility];  Service: General;  Laterality: N/A;   XI ROBOTIC ASSISTED LOWER ANTERIOR RESECTION N/A 07/29/2022   Procedure: XI ROBOTIC ASSISTED REDO LOWER ANTERIOR RESECTION;  Surgeon: Teresa Lonni HERO, MD;  [facility];  Service: General;  Laterality: N/A;    Prior to Admission medications  Medication Sig Start Date End Date Taking? Authorizing Provider  Multiple Vitamin (MULTIVITAMIN WITH MINERALS) TABS tablet Take 1 tablet by mouth daily.    [provider]  Multiple Vitamins-Minerals (ZINC PO) Take 1 tablet by mouth daily.    [provider]    Scheduled Meds:  diatrizoate  meglumine -sodium  90 mL Per NG tube Once   enoxaparin  (LOVENOX ) injection  30 mg Subcutaneous Q24H   sodium phosphate   1 enema Rectal Once   Followed by   [START ON 08/18/2024] sodium phosphate   1 enema Rectal Once   Continuous Infusions:  dextrose  5% lactated ringers  Stopped (08/17/24 1154)   PRN Meds:.acetaminophen  **OR** acetaminophen , hydrALAZINE , ondansetron  **OR** ondansetron  (ZOFRAN ) IV  Allergies as of 08/16/2024 - Reviewed 08/16/2024  Allergen Reaction Noted   Wasp venom Anaphylaxis 12/07/2013   Codeine Nausea And Vomiting 07/28/2017    History reviewed. No pertinent family history.  Social History   Socioeconomic History   Marital  status: Widowed    Spouse name: Not on file   Number of children: Not on file   Years of education: Not on file   Highest education level: Not on file  Occupational History   Not on file  Tobacco Use   Smoking status: Never   Smokeless tobacco: Never  Vaping Use   Vaping status: Never Used  Substance and Sexual  Activity   Alcohol use: Yes    Comment: socially   Drug use: No   Sexual activity: Not on file  Other Topics Concern   Not on file  Social History Narrative   Not on file   Social Drivers of Health   Tobacco Use: Low Risk (08/16/2024)   Patient History    Smoking Tobacco Use: Never    Smokeless Tobacco Use: Never    Passive Exposure: Not on file  Financial Resource Strain: Not on file  Food Insecurity: No Food Insecurity (08/17/2024)   Epic    Worried About Programme Researcher, Broadcasting/film/video in the Last Year: Never true    Ran Out of Food in the Last Year: Never true  Transportation Needs: No Transportation Needs (08/17/2024)   Epic    Lack of Transportation (Medical): No    Lack of Transportation (Non-Medical): No  Physical Activity: Not on file  Stress: Not on file  Social Connections: Moderately Isolated (08/17/2024)   Social Connection and Isolation Panel    Frequency of Communication with Friends and Family: More than three times a week    Frequency of Social Gatherings with Friends and Family: More than three times a week    Attends Religious Services: More than 4 times per year    Active Member of [employer] or Organizations: No    Attends Banker Meetings: Never    Marital Status: Widowed  Intimate Partner Violence: Not At Risk (08/17/2024)   Epic    Fear of Current or Ex-Partner: No    Emotionally Abused: No    Physically Abused: No    Sexually Abused: No  Depression (PHQ2-9): Not on file  Alcohol Screen: Not on file  Housing: Low Risk (08/17/2024)   Epic    Unable to Pay for Housing in the Last Year: No    Number of Times Moved in the Last Year: 0    Homeless in the Last Year: No  Utilities: Not At Risk (08/17/2024)   Epic    Threatened with loss of utilities: No  Health Literacy: Not on file    Review of Systems: All negative except as stated above in HPI.  Physical Exam: Vital signs: Vitals:   08/17/24 1032 08/17/24 1355  BP: (!) 141/96 (!) 124/97  Pulse: 92 (!)  119  Resp: 18 20  Temp: 98.3 F (36.8 C) 98.3 F (36.8 C)  SpO2: 98% 96%   Last BM Date : 08/15/24 General:   Lethargic, elderly, thin, no acute distress Head: normocephalic, atraumatic Eyes: anicteric sclera ENT: NG in place with dark bilious fluid in tubing Neck: supple, nontender Lungs:  Clear throughout to auscultation.   No wheezes, crackles, or rhonchi. No acute distress. Heart:  Regular rate and rhythm; no murmurs, clicks, rubs,  or gallops. Abdomen: mild distention, nontender, soft  Rectal:  Deferred Ext: no edema  GI:  Lab Results: Recent Labs    08/16/24 1615 08/17/24 0611  WBC 8.5 7.9  HGB 17.5* 16.2*  HCT 50.2* 47.5*  PLT 364 309   BMET Recent Labs  08/16/24 1615 08/17/24 0611  NA 130* 134*  K 3.8 2.8*  CL 81* 85*  CO2 31 35*  GLUCOSE 139* 98  BUN 48* 47*  CREATININE 1.81* 1.73*  CALCIUM 10.5* 9.4   LFT Recent Labs    08/16/24 1615  PROT 8.1  ALBUMIN 4.8  AST 25  ALT 9  ALKPHOS 100  BILITOT 0.8   PT/INR Recent Labs    08/17/24 0611  LABPROT 12.7  INR 0.9       Impression/Plan: History of recurrent colon cancer with SBO concerning for mets and concern for tumor recurrence at colon anastomosis. Will do flex sig tomorrow to evaluate anastomosis for recurrence. If inconclusive, then will need biopsy of presacral mass by IR. Fleets enemas X 2. Keep NPO and NG intermittent suction. Supportive care.    LOS: 0 days   Jerrell JAYSON Sol  08/17/2024, 3:01 PM  Questions please call [phone]

## 2024-08-17 NOTE — Anesthesia Preprocedure Evaluation (Signed)
 "                                  Anesthesia Evaluation  Patient identified by MRN, date of birth, ID band Patient awake    Reviewed: Allergy & Precautions, NPO status , Patient's Chart, lab work & pertinent test results  Airway Mallampati: II  TM Distance: >3 FB     Dental  (+) Partial Lower, Caps, Dental Advisory Given   Pulmonary pneumonia, resolved   Pulmonary exam normal breath sounds clear to auscultation       Cardiovascular hypertension, Normal cardiovascular exam Rhythm:Regular Rate:Normal     Neuro/Psych negative neurological ROS  negative psych ROS   GI/Hepatic Neg liver ROS,,,Hx/o Colon Ca S/P Low anterior resection 2018 Probable recurrence of sigmoid colon Ca Partial small bowel obstruction Abnormal imaging   Endo/Other  negative endocrine ROS    Renal/GU Renal InsufficiencyRenal diseaseLab Results      Component                Value               Date                      NA                       134 (L)             08/17/2024                CL                       85 (L)              08/17/2024                K                        2.8 (L)             08/17/2024                CO2                      35 (H)              08/17/2024                BUN                      47 (H)              08/17/2024                CREATININE               1.73 (H)            08/17/2024                GFRNONAA                 28 (L)              08/17/2024                CALCIUM  9.4                 08/17/2024                PHOS                     3.0                 08/16/2017                ALBUMIN                  4.8                 08/16/2024                GLUCOSE                  98                  08/17/2024             negative genitourinary   Musculoskeletal negative musculoskeletal ROS (+)    Abdominal   Peds  Hematology Lab Results      Component                Value               Date                      WBC                       7.9                 08/17/2024                HGB                      16.2 (H)            08/17/2024                HCT                      47.5 (H)            08/17/2024                MCV                      90.5                08/17/2024                PLT                      309                 08/17/2024              Anesthesia Other Findings   Reproductive/Obstetrics                              Anesthesia Physical Anesthesia Plan  ASA: 2  Anesthesia Plan: MAC   Post-op Pain Management: Minimal or no pain anticipated   Induction: Intravenous  PONV Risk Score and Plan: 3 and Treatment may  vary due to age or medical condition and Propofol  infusion  Airway Management Planned: Natural Airway and Simple Face Mask  Additional Equipment: None  Intra-op Plan:   Post-operative Plan:   Informed Consent: I have reviewed the patients History and Physical, chart, labs and discussed the procedure including the risks, benefits and alternatives for the proposed anesthesia with the patient or authorized representative who has indicated his/her understanding and acceptance.     Dental advisory given  Plan Discussed with: CRNA and Anesthesiologist  Anesthesia Plan Comments:          Anesthesia Quick Evaluation  "

## 2024-08-17 NOTE — ED Notes (Signed)
 SpO2 dipping to mid 80's. Placed on 2L nasal canula. SpO2 now 93%

## 2024-08-18 ENCOUNTER — Encounter (HOSPITAL_COMMUNITY): Payer: Self-pay | Admitting: Internal Medicine

## 2024-08-18 ENCOUNTER — Inpatient Hospital Stay (HOSPITAL_COMMUNITY): Payer: Self-pay | Admitting: Anesthesiology

## 2024-08-18 ENCOUNTER — Inpatient Hospital Stay (HOSPITAL_COMMUNITY)

## 2024-08-18 ENCOUNTER — Other Ambulatory Visit: Payer: Self-pay

## 2024-08-18 ENCOUNTER — Encounter (HOSPITAL_COMMUNITY): Payer: Self-pay | Admitting: Anesthesiology

## 2024-08-18 ENCOUNTER — Encounter (HOSPITAL_COMMUNITY)
Admission: EM | Disposition: A | Discharge status: Home / Self Care | Payer: Self-pay | Source: Ambulatory Visit | Attending: Internal Medicine

## 2024-08-18 DIAGNOSIS — I10 Essential (primary) hypertension: Secondary | ICD-10-CM | POA: Diagnosis not present

## 2024-08-18 DIAGNOSIS — C19 Malignant neoplasm of rectosigmoid junction: Secondary | ICD-10-CM

## 2024-08-18 DIAGNOSIS — K56609 Unspecified intestinal obstruction, unspecified as to partial versus complete obstruction: Secondary | ICD-10-CM | POA: Diagnosis not present

## 2024-08-18 HISTORY — PX: BONE BIOPSY: SHX375

## 2024-08-18 HISTORY — PX: FLEXIBLE SIGMOIDOSCOPY: SHX5431

## 2024-08-18 LAB — HEPATIC FUNCTION PANEL
ALT: 13 U/L (ref 0–44)
AST: 34 U/L (ref 15–41)
Albumin: 3.9 g/dL (ref 3.5–5.0)
Alkaline Phosphatase: 84 U/L (ref 38–126)
Bilirubin, Direct: 0.4 mg/dL — ABNORMAL HIGH (ref 0.0–0.2)
Indirect Bilirubin: 0.5 mg/dL (ref 0.3–0.9)
Total Bilirubin: 0.9 mg/dL (ref 0.0–1.2)
Total Protein: 6.8 g/dL (ref 6.5–8.1)

## 2024-08-18 LAB — PROTIME-INR
INR: 1 (ref 0.8–1.2)
Prothrombin Time: 13.3 s (ref 11.4–15.2)

## 2024-08-18 LAB — BASIC METABOLIC PANEL WITH GFR
Anion gap: 13 (ref 5–15)
BUN: 47 mg/dL — ABNORMAL HIGH (ref 8–23)
CO2: 35 mmol/L — ABNORMAL HIGH (ref 22–32)
Calcium: 9 mg/dL (ref 8.9–10.3)
Chloride: 88 mmol/L — ABNORMAL LOW (ref 98–111)
Creatinine, Ser: 1.3 mg/dL — ABNORMAL HIGH (ref 0.44–1.00)
GFR, Estimated: 39 mL/min — ABNORMAL LOW
Glucose, Bld: 114 mg/dL — ABNORMAL HIGH (ref 70–99)
Potassium: 2.8 mmol/L — ABNORMAL LOW (ref 3.5–5.1)
Sodium: 136 mmol/L (ref 135–145)

## 2024-08-18 LAB — MAGNESIUM: Magnesium: 2.6 mg/dL — ABNORMAL HIGH (ref 1.7–2.4)

## 2024-08-18 MED ORDER — POTASSIUM CHLORIDE 10 MEQ/100ML IV SOLN
10.0000 meq | INTRAVENOUS | Status: AC
  Administered 2024-08-18 (×4): 10 meq via INTRAVENOUS
  Filled 2024-08-18 (×4): qty 100

## 2024-08-18 MED ORDER — PROPOFOL 10 MG/ML IV BOLUS
INTRAVENOUS | Status: DC | PRN
  Administered 2024-08-18: 20 mg via INTRAVENOUS

## 2024-08-18 MED ORDER — SODIUM CHLORIDE 0.9 % IV SOLN: INTRAVENOUS | Status: DC

## 2024-08-18 MED ORDER — SODIUM CHLORIDE 0.9 % IV SOLN: INTRAVENOUS | Status: DC | PRN

## 2024-08-18 MED ORDER — PROPOFOL 500 MG/50ML IV EMUL
INTRAVENOUS | Status: DC | PRN
  Administered 2024-08-18: 180 ug/kg/min via INTRAVENOUS

## 2024-08-18 MED ORDER — IOHEXOL 300 MG/ML  SOLN
75.0000 mL | Freq: Once | INTRAMUSCULAR | Status: AC | PRN
  Administered 2024-08-18: 75 mL via INTRAVENOUS

## 2024-08-18 MED ORDER — PHENYLEPHRINE 80 MCG/ML (10ML) SYRINGE FOR IV PUSH (FOR BLOOD PRESSURE SUPPORT)
PREFILLED_SYRINGE | INTRAVENOUS | Status: DC | PRN
  Administered 2024-08-18: 160 ug via INTRAVENOUS

## 2024-08-18 MED ORDER — POTASSIUM CHLORIDE IN NACL 40-0.9 MEQ/L-% IV SOLN
INTRAVENOUS | Status: AC
  Filled 2024-08-18 (×2): qty 1000

## 2024-08-18 MED ORDER — LIDOCAINE 2% (20 MG/ML) 5 ML SYRINGE
INTRAMUSCULAR | Status: DC | PRN
  Administered 2024-08-18: 40 mg via INTRAVENOUS

## 2024-08-18 NOTE — Transfer of Care (Signed)
 Immediate Anesthesia Transfer of Care Note  Patient: Tracy Blackwell  Procedure(s) Performed: SIGMOIDOSCOPY, FLEXIBLE BIOPSY, GI  Patient Location: PACU  Anesthesia Type:MAC  Level of Consciousness: sedated  Airway & Oxygen Therapy: Patient Spontanous Breathing and Patient connected to face mask oxygen  Post-op Assessment: Report given to RN and Post -op Vital signs reviewed and stable  Post vital signs: Reviewed and stable  Last Vitals:  Vitals Value Taken Time  BP 100/72 08/18/24 09:15  Temp 36.7 C 08/18/24 09:14  Pulse 48 08/18/24 09:18  Resp 29 08/18/24 09:18  SpO2 100 % 08/18/24 09:18  Vitals shown include unfiled device data.  Last Pain:  Vitals:   08/18/24 0914  TempSrc:   PainSc: 0-No pain         Complications: No notable events documented.

## 2024-08-18 NOTE — Op Note (Signed)
 [facility] Patient Name: Tracy Blackwell Procedure Date: 08/18/2024 MRN: [id number] Attending MD: Jerrell JAYSON Sol , MD, [id number] Date of Birth: 1934-06-28 CSN: [id number] Age: 89 Admit Type: Inpatient Procedure:                Flexible Sigmoidoscopy Indications:              Abnormal CT of the GI tract, Colorectal cancer,                            Colon mass Providers:                Jerrell KYM Sol, MD, Collene Edu, RN, Fairy Marina, Technician Referring MD:             hospital team Medicines:                Propofol  per Anesthesia, Monitored Anesthesia Care Complications:            No immediate complications. Estimated Blood Loss:     Estimated blood loss was minimal. Procedure:                Pre-Anesthesia Assessment:                           - Prior to the procedure, a History and Physical                            was performed, and patient medications and                            allergies were reviewed. The patient's tolerance of                            previous anesthesia was also reviewed. The risks                            and benefits of the procedure and the sedation                            options and risks were discussed with the patient.                            All questions were answered, and informed consent                            was obtained. Prior Anticoagulants: The patient has                            taken no anticoagulant or antiplatelet agents. ASA                            Grade Assessment: II - A patient with mild systemic  disease. After reviewing the risks and benefits,                            the patient was deemed in satisfactory condition to                            undergo the procedure.                           After obtaining informed consent, the scope was                            passed under direct vision. The [id number]-[id number]                             ([id number] Olympus colonoscope was introduced                            through the anus and advanced to the the                            rectosigmoid junction. The flexible sigmoidoscopy                            was performed with difficulty due to a partially                            obstructing mass. Scope In: 8:54:39 AM Scope Out: 9:01:06 AM Total Procedure Duration: 0 hours 6 minutes 27 seconds  Findings:      Hemorrhoids were found on perianal exam.      A fungating partially obstructing large mass was found in the       recto-sigmoid colon and at 10 cm proximal to the anus. The mass was       circumferential. Oozing was present. Biopsies were taken with a cold       forceps for histology. Estimated blood loss was minimal.      Internal hemorrhoids were found during retroflexion. The hemorrhoids       were medium-sized and Grade I (internal hemorrhoids that do not       prolapse). Impression:               - Hemorrhoids found on perianal exam.                           - Malignant partially obstructing tumor in the                            recto-sigmoid colon and at 10 cm proximal to the                            anus. Biopsied.                           - Internal hemorrhoids. Moderate Sedation:      N/A - MAC procedure Recommendation:           -  Surgery consult for diverting ostomy.                           - NPO.                           - Await pathology results. Procedure Code(s):        --- Professional ---                           [phone], 52, Sigmoidoscopy, flexible; with biopsy,                            single or multiple Diagnosis Code(s):        --- Professional ---                           C19, Malignant neoplasm of rectosigmoid junction                           C18.9, Malignant neoplasm of colon, unspecified                           K56.690, Other partial intestinal obstruction                           R93.3, Abnormal findings on  diagnostic imaging of                            other parts of digestive tract                           K64.0, First degree hemorrhoids CPT copyright 2022 [employer]. All rights reserved. The codes documented in this report are preliminary and upon coder review may  be revised to meet current compliance requirements. Jerrell JAYSON Sol, MD 08/18/2024 9:14:39 AM This report has been signed electronically. Number of Addenda: 0

## 2024-08-18 NOTE — Plan of Care (Signed)
   Problem: Education: Goal: Knowledge of General Education information will improve Description: Including pain rating scale, medication(s)/side effects and non-pharmacologic comfort measures Outcome: Progressing   Problem: Clinical Measurements: Goal: Ability to maintain clinical measurements within normal limits will improve Outcome: Progressing

## 2024-08-18 NOTE — Progress Notes (Signed)
 Pt in Afib and is asymptomatic no SOB, N/V, chest pain or dizziness.  EKG complete and vitals signs. Provider notified face-to-face encounter. Labs ordered K+ 2.8 See orders for K+ replacement. No signs of distress noted at this time. Will continue with plan of care and notify provider with any changes.

## 2024-08-18 NOTE — Progress Notes (Signed)
 * Day of Surgery *   Subjective/Chief Complaint: Passing flatus this morning Denies abdominal pain Still high output from NG   Objective: Vital signs in last 24 hours: Temp:  [98.3 F (36.8 C)-98.8 F (37.1 C)] 98.6 F (37 C) (01/04 0448) Pulse Rate:  [86-123] 109 (01/04 0448) Resp:  [14-20] 16 (01/04 0448) BP: (124-150)/(84-99) 138/84 (01/04 0448) SpO2:  [94 %-98 %] 94 % (01/04 0448) Last BM Date : 08/15/24  Intake/Output from previous day: 01/03 0701 - 01/04 0700 In: 1037.5 [I.V.:912.5; NG/GT:125] Out: 1200 [Emesis/NG output:1200] Intake/Output this shift: No intake/output data recorded.  Exam: Awake and alert Comfortable Abdomen soft, non-distended, non-tender  Lab Results:  Recent Labs    08/16/24 1615 08/17/24 0611  WBC 8.5 7.9  HGB 17.5* 16.2*  HCT 50.2* 47.5*  PLT 364 309   BMET Recent Labs    08/17/24 0611 08/18/24 0028  NA 134* 136  K 2.8* 2.8*  CL 85* 88*  CO2 35* 35*  GLUCOSE 98 114*  BUN 47* 47*  CREATININE 1.73* 1.30*  CALCIUM 9.4 9.0   PT/INR Recent Labs    08/17/24 0611 08/18/24 0028  LABPROT 12.7 13.3  INR 0.9 1.0   ABG No results for input(s): PHART, HCO3 in the last 72 hours.  Invalid input(s): PCO2, PO2  Studies/Results: DG Abd 1 View Result Date: 08/17/2024 EXAM: 1 VIEW XRAY OF THE ABDOMEN 08/17/2024 12:29:00 AM COMPARISON: None available. CLINICAL HISTORY: Encounter for nasogastric (NG) tube placement. FINDINGS: LINES, TUBES AND DEVICES: NG tube the tip is in the proximal stomach with the side port near the GE junction. BOWEL: Dilated upper abdomen small bowel loops. SOFT TISSUES: No abnormal calcifications. BONES: No acute fracture. IMPRESSION: 1. Nasogastric tube tip in the proximal stomach with the side port near the gastroesophageal junction; consider advancement. Electronically signed by: Franky Crease MD 08/17/2024 12:31 AM EST RP Workstation: [id number]   CT ABDOMEN PELVIS WO CONTRAST Result Date:  08/16/2024 EXAM: CT ABDOMEN AND PELVIS WITHOUT CONTRAST 08/16/2024 09:59:37 PM TECHNIQUE: CT of the abdomen and pelvis was performed without the administration of intravenous contrast. Multiplanar reformatted images are provided for review. Automated exposure control, iterative reconstruction, and/or weight-based adjustment of the mA/kV was utilized to reduce the radiation dose to as low as reasonably achievable. COMPARISON: Prior study for comparison. CLINICAL HISTORY: Bowel obstruction suspected. FINDINGS: LOWER CHEST: Calcified visualized coronary arteries. LIVER: The liver is unremarkable. GALLBLADDER AND BILE DUCTS: Gallbladder is unremarkable. No biliary ductal dilatation. SPLEEN: No acute abnormality. PANCREAS: No acute abnormality. ADRENAL GLANDS: No acute abnormality. KIDNEYS, URETERS AND BLADDER: Multiple bilateral renal cysts are stable. Per consensus, no follow-up is needed for simple Bosniak type 1 and 2 renal cysts, unless the patient has a malignancy history or risk factors. Mild right hydronephrosis, new since prior study. This may be related to the abnormal presacral soft tissue mass. No visible stones. No perinephric or periureteral stranding. Urinary bladder is unremarkable. GI AND BOWEL: Dilated, fluid-filled stomach and proximal to mid small bowel loops are dilated into the pelvis. Distal small bowel is decompressed. Findings are compatible with mid to distal small bowel obstruction. There are small bowel loops in the region of the presacral mass; this could be the cause of bowel obstruction. Postoperative changes in the rectosigmoid colon. Questionable recurrent mass at the suture line. Colonic diverticulosis. Normal appendix. PERITONEUM AND RETROPERITONEUM: Abnormal presacral soft tissue measures 4 x 4 cm on image 59, compared to 2.9 x 2.3 cm previously. Findings are concerning for tumor recurrence.  No ascites. No free air. VASCULATURE: Calcified aorta. LYMPH NODES: No lymphadenopathy.  REPRODUCTIVE ORGANS: No acute abnormality. BONES AND SOFT TISSUES: No acute osseous abnormality. No focal soft tissue abnormality. IMPRESSION: 1. Findings compatible with mid to distal small bowel obstruction. 2. Enlarging presacral soft tissue mass is concerning for tumor recurrence and may be the etiology of the small bowel obstruction and right hydronephrosis. 3. Postoperative changes in the rectosigmoid colon with fullness noted in the colon adjacent to the suture line cannot exclude tumor recurrence. 4. Diffuse coronary artery and aortoiliac atherosclerosis. Electronically signed by: Franky Crease MD 08/16/2024 11:06 PM EST RP Workstation: [id number]    Anti-infectives: Anti-infectives (From admission, onward)    None       Assessment/Plan: History of recurrent colorectal cancer Presacral mass Possible tumor recurrence at colorectal anastomosis Small bowel obstruction likely due to presacral mass Mild right hydronephrosis AKI  -for flex sig today to evaluate possible tumor recurrence -small bowel protocol to follow to r/o SBO -medical oncology will need to see for their opinion  Moderate medical decision making  Vicenta Poli MD 08/18/2024

## 2024-08-18 NOTE — Interval H&P Note (Signed)
 History and Physical Interval Note:  08/18/2024 8:42 AM  Tracy Blackwell  has presented today for surgery, with the diagnosis of History of colon cancer; Abnormal imaging.  The various methods of treatment have been discussed with the patient and family. After consideration of risks, benefits and other options for treatment, the patient has consented to  Procedures: SIGMOIDOSCOPY, FLEXIBLE (N/A) as a surgical intervention.  The patient's history has been reviewed, patient examined, no change in status, stable for surgery.  I have reviewed the patient's chart and labs.  Questions were answered to the patient's satisfaction.     Jerrell JAYSON Sol

## 2024-08-18 NOTE — Progress Notes (Signed)
 " PROGRESS NOTE    Tracy Blackwell  FMW:[id number] DOB: 09-01-1933 DOA: 08/16/2024 PCP: Chrystal Lamarr RAMAN, MD  Brief Narrative:  89 y.o. female with medical history significant of rectal cancer, hypertension who presents emergency department with nausea vomiting decreased appetite.  Patient has a prior history of colon resection.  She was found to have a distal small bowel obstruction with enlarging presacral soft tissue mass.  She was admitted for the same.   Assessment & Plan:   Principal Problem:   Small bowel obstruction (HCC) Active Problems:   Partial small bowel obstruction (HCC)  # 1 Small bowel obstruction secondary to recurrent colorectal cancer. CT abdomen on the day of admission- Findings compatible with mid to distal small bowel obstruction. Enlarging presacral soft tissue mass is concerning for tumor recurrence and may be the etiology of the small bowel obstruction and right hydronephrosis. Postoperative changes in the rectosigmoid colon with fullness noted in the colon adjacent to the suture line cannot exclude tumor recurrence. Diffuse coronary artery and aortoiliac atherosclerosis. Dr. Dianna did a flexible sigmoidoscopy 08/18/2024-fungating partially obstructing large mass in the rectosigmoid region at 10 cm proximal to the anus.   KUB 08/18/2024 shows persistent small bowel obstruction Continue NG tube to low intermittent suction. Surgery GI and oncology following. Follow-up biopsy Follow up CT Chest   # AKI- resolving likely due to dehydration.  Continue  IV fluids   # Hypovolemic hyponatremia-resolving   # Hypertension-patient is not on home antihypertensives.  Will continue as needed hydralazine    # Hypokalemia replete aggressively recheck labs in AM. Estimated body mass index is 28.87 kg/m as calculated from the following:   Height as of this encounter: 5' 2 (1.575 m).   Weight as of this encounter: 71.6 kg.  DVT prophylaxis: Lovenox  Code Status: Full  code Family Communication: None at bedside Disposition Plan:  Status is: Inpatient   Consultants: gi surgery  Procedures: Flex sig 08/18/2024 Antimicrobials: Zosyn Subjective:  Anxious to go home denies any abdominal pain NG tube in place  Objective: Vitals:   08/18/24 0914 08/18/24 0915 08/18/24 0930 08/18/24 1358  BP: 102/63 100/72 (!) 134/90 133/79  Pulse:  79 (!) 110 83  Resp:  (!) 33 (!) 26 18  Temp: 98 F (36.7 C)   (!) 97.5 F (36.4 C)  TempSrc:    Oral  SpO2:  100% 96% 95%  Weight:      Height:        Intake/Output Summary (Last 24 hours) at 08/18/2024 1537 Last data filed at 08/18/2024 1500 Gross per 24 hour  Intake 557.53 ml  Output 1200 ml  Net -642.47 ml   Filed Weights   08/17/24 0152  Weight: 71.6 kg    Examination:  General exam: Appears in no acute distress Respiratory system: Clear to auscultation. Respiratory effort normal. Cardiovascular system: S1 & S2 heard, RRR. No JVD, murmurs, rubs, gallops or clicks. No pedal edema. Gastrointestinal system: Abdomen is nondistended, soft and nontender. No organomegaly or masses felt. Normal bowel sounds heard. Central nervous system: Alert and oriented. No focal neurological deficits. Extremities: No edema    Data Reviewed: I have personally reviewed following labs and imaging studies  CBC: Recent Labs  Lab 08/16/24 1615 08/17/24 0611  WBC 8.5 7.9  HGB 17.5* 16.2*  HCT 50.2* 47.5*  MCV 90.0 90.5  PLT 364 309   Basic Metabolic Panel: Recent Labs  Lab 08/16/24 1615 08/17/24 0611 08/18/24 0028  NA 130* 134* 136  K 3.8  2.8* 2.8*  CL 81* 85* 88*  CO2 31 35* 35*  GLUCOSE 139* 98 114*  BUN 48* 47* 47*  CREATININE 1.81* 1.73* 1.30*  CALCIUM 10.5* 9.4 9.0  MG  --   --  2.6*   GFR: Estimated Creatinine Clearance: 27.7 mL/min (A) (by C-G formula based on SCr of 1.3 mg/dL (H)). Liver Function Tests: Recent Labs  Lab 08/16/24 1615 08/18/24 0028  AST 25 34  ALT 9 13  ALKPHOS 100 84  BILITOT  0.8 0.9  PROT 8.1 6.8  ALBUMIN 4.8 3.9   Recent Labs  Lab 08/16/24 1615  LIPASE 96*   No results for input(s): AMMONIA in the last 168 hours. Coagulation Profile: Recent Labs  Lab 08/17/24 0611 08/18/24 0028  INR 0.9 1.0   Cardiac Enzymes: No results for input(s): CKTOTAL, CKMB, CKMBINDEX, TROPONINI in the last 168 hours. BNP (last 3 results) No results for input(s): PROBNP in the last 8760 hours. HbA1C: No results for input(s): HGBA1C in the last 72 hours. CBG: No results for input(s): GLUCAP in the last 168 hours. Lipid Profile: No results for input(s): CHOL, HDL, LDLCALC, TRIG, CHOLHDL, LDLDIRECT in the last 72 hours. Thyroid  Function Tests: No results for input(s): TSH, T4TOTAL, FREET4, T3FREE, THYROIDAB in the last 72 hours. Anemia Panel: No results for input(s): VITAMINB12, FOLATE, FERRITIN, TIBC, IRON, RETICCTPCT in the last 72 hours. Sepsis Labs: No results for input(s): PROCALCITON, LATICACIDVEN in the last 168 hours.  No results found for this or any previous visit (from the past 240 hours).       Radiology Studies: DG Abd 1 View Result Date: 08/18/2024 EXAM: 1 VIEW XRAY OF THE ABDOMEN 08/18/2024 12:40:30 PM COMPARISON: 08/17/2024 CLINICAL HISTORY: SBO (small bowel obstruction) (HCC) FINDINGS: LINES, TUBES AND DEVICES: Enteric tube in place with tip and side port overlying the expected region of the gastric lumen. BOWEL: Bowel suture staples in pelvis. Dilute enteric contrast is seen within the small bowel. Dense stool in the right colon as seen on CT 08/16/2024 limits assessment for progression of enteric contrast. Persistent small bowel dilation. SOFT TISSUES: No abnormal calcifications. BONES: Degenerative changes of the lumbar spine. No acute fracture. IMPRESSION: 1. Persistent small bowel obstruction; limited assessment for progression of enteric contrast due to dense stool in the colon . Electronically  signed by: Norman Gatlin MD 08/18/2024 12:49 PM EST RP Workstation: [id number]   DG Abd 1 View Result Date: 08/17/2024 EXAM: 1 VIEW XRAY OF THE ABDOMEN 08/17/2024 12:29:00 AM COMPARISON: None available. CLINICAL HISTORY: Encounter for nasogastric (NG) tube placement. FINDINGS: LINES, TUBES AND DEVICES: NG tube the tip is in the proximal stomach with the side port near the GE junction. BOWEL: Dilated upper abdomen small bowel loops. SOFT TISSUES: No abnormal calcifications. BONES: No acute fracture. IMPRESSION: 1. Nasogastric tube tip in the proximal stomach with the side port near the gastroesophageal junction; consider advancement. Electronically signed by: Franky Crease MD 08/17/2024 12:31 AM EST RP Workstation: [id number]   CT ABDOMEN PELVIS WO CONTRAST Result Date: 08/16/2024 EXAM: CT ABDOMEN AND PELVIS WITHOUT CONTRAST 08/16/2024 09:59:37 PM TECHNIQUE: CT of the abdomen and pelvis was performed without the administration of intravenous contrast. Multiplanar reformatted images are provided for review. Automated exposure control, iterative reconstruction, and/or weight-based adjustment of the mA/kV was utilized to reduce the radiation dose to as low as reasonably achievable. COMPARISON: Prior study for comparison. CLINICAL HISTORY: Bowel obstruction suspected. FINDINGS: LOWER CHEST: Calcified visualized coronary arteries. LIVER: The liver is unremarkable. GALLBLADDER AND BILE DUCTS:  Gallbladder is unremarkable. No biliary ductal dilatation. SPLEEN: No acute abnormality. PANCREAS: No acute abnormality. ADRENAL GLANDS: No acute abnormality. KIDNEYS, URETERS AND BLADDER: Multiple bilateral renal cysts are stable. Per consensus, no follow-up is needed for simple Bosniak type 1 and 2 renal cysts, unless the patient has a malignancy history or risk factors. Mild right hydronephrosis, new since prior study. This may be related to the abnormal presacral soft tissue mass. No visible stones. No perinephric or  periureteral stranding. Urinary bladder is unremarkable. GI AND BOWEL: Dilated, fluid-filled stomach and proximal to mid small bowel loops are dilated into the pelvis. Distal small bowel is decompressed. Findings are compatible with mid to distal small bowel obstruction. There are small bowel loops in the region of the presacral mass; this could be the cause of bowel obstruction. Postoperative changes in the rectosigmoid colon. Questionable recurrent mass at the suture line. Colonic diverticulosis. Normal appendix. PERITONEUM AND RETROPERITONEUM: Abnormal presacral soft tissue measures 4 x 4 cm on image 59, compared to 2.9 x 2.3 cm previously. Findings are concerning for tumor recurrence. No ascites. No free air. VASCULATURE: Calcified aorta. LYMPH NODES: No lymphadenopathy. REPRODUCTIVE ORGANS: No acute abnormality. BONES AND SOFT TISSUES: No acute osseous abnormality. No focal soft tissue abnormality. IMPRESSION: 1. Findings compatible with mid to distal small bowel obstruction. 2. Enlarging presacral soft tissue mass is concerning for tumor recurrence and may be the etiology of the small bowel obstruction and right hydronephrosis. 3. Postoperative changes in the rectosigmoid colon with fullness noted in the colon adjacent to the suture line cannot exclude tumor recurrence. 4. Diffuse coronary artery and aortoiliac atherosclerosis. Electronically signed by: Kevin Dover MD 08/16/2024 11:06 PM EST RP Workstation: [id number]    Scheduled Meds:  diatrizoate  meglumine -sodium  90 mL Per NG tube Once   enoxaparin  (LOVENOX ) injection  30 mg Subcutaneous Q24H   Continuous Infusions:  0.9 % NaCl with KCl 40 mEq / L 40 mL/hr at 08/18/24 1109     LOS: 1 day    Almarie KANDICE Hoots, MD 08/18/2024, 3:37 PM   "

## 2024-08-18 NOTE — Anesthesia Postprocedure Evaluation (Signed)
"   Anesthesia Post Note  Patient: Tracy Blackwell  Procedure(s) Performed: SIGMOIDOSCOPY, FLEXIBLE BIOPSY, GI     Patient location during evaluation: PACU Anesthesia Type: MAC Level of consciousness: awake and alert and oriented Pain management: pain level controlled Vital Signs Assessment: post-procedure vital signs reviewed and stable Respiratory status: spontaneous breathing, nonlabored ventilation and respiratory function stable Cardiovascular status: stable and blood pressure returned to baseline Postop Assessment: no apparent nausea or vomiting Anesthetic complications: no   No notable events documented.  Last Vitals:  Vitals:   08/18/24 0914 08/18/24 0915  BP: 102/63 100/72  Pulse:  79  Resp:  (!) 33  Temp: 36.7 C   SpO2:  100%    Last Pain:  Vitals:   08/18/24 0929  TempSrc:   PainSc: 0-No pain                 Neave Lenger A.      "

## 2024-08-18 NOTE — Progress Notes (Signed)
 Jaydalyn Demattia Ledgerwood   DOB:1934-02-06   FM#:[id number]    ASSESSMENT & PLAN:  Recurrent colorectal cancer causing small bowel obstruction I tried to explain to the patient the gravity of her current situation She told the patient that nobody explained test results to her The patient have recurrent colorectal cancer causing small bowel obstruction We discussed the importance of NG tube decompression I recommend staging CT imaging of her chest as well as repeat abdominal x-ray today to see if her small bowel is a decompressed Based on appearance on her sigmoidoscopy, I suspect she will need diversion colostomy as well as chemotherapy It is not clear to me that she is a candidate for repeat surgical resection but I will defer that to general surgery for input As hide explained to the patient the situation, all she cares about is getting the NG tube removed I will return tomorrow once we have further results Her primary oncologist is not available until January 12  Severe electrolyte imbalance Acute renal failure, resolving Continue IV fluid support Continue to trend her CMP  Elevated hemoglobin Likely a sign of dehydration  Discharge planning It is unlikely she will be discharged over the next 2 to 3 days  Almarie Bedford, MD 08/18/2024 12:59 PM  Subjective:  This is a patient of Dr. Cloretta, being admitted for small bowel obstruction I reviewed her records extensively She was first diagnosed in 2018 when she presented with rectosigmoid mass, underwent surgery in August 14, 2017 It appears that she never received adjuvant treatment She had disease recurrence on May 30, 2022 with partially obstructing mass, biopsy confirmed recurrent cancer and she underwent repeat resection.  According to tumor board evaluation, she had T4 disease recurrence.  She was recommended to undergo adjuvant treatment but the patient declined She was supposed to undergo repeat colonoscopy but due to normal-appearing  CT imaging in March 2025, she decided against surveillance colonoscopy Yesterday, she presented to the emergency department for abdominal pain, nausea and vomiting.  NG tube was placed.  CT imaging show signs of small bowel obstruction with perirectal mass.  Sigmoidoscopy was done today which showed evidence of recurrent cancer, approximately 10 cm from anal verge According to the patient, she did notice some minor changes in her bowel habits and reduced appetite for the past few weeks but did not bring to any physician's attention She denies recent rectal bleeding I have reviewed her CT imaging, sigmoidoscopy report and labs  Patient repeatedly asked me when she can go home.  It does not appear to me she has any insight to the gravity of her disease/situation  Objective:  Vitals:   08/18/24 0915 08/18/24 0930  BP: 100/72 (!) 134/90  Pulse: 79 (!) 110  Resp: (!) 33 (!) 26  Temp:    SpO2: 100% 96%     Intake/Output Summary (Last 24 hours) at 08/18/2024 1259 Last data filed at 08/18/2024 0901 Gross per 24 hour  Intake 406.11 ml  Output 1200 ml  Net -793.89 ml

## 2024-08-19 ENCOUNTER — Inpatient Hospital Stay (HOSPITAL_COMMUNITY)

## 2024-08-19 DIAGNOSIS — K56609 Unspecified intestinal obstruction, unspecified as to partial versus complete obstruction: Secondary | ICD-10-CM | POA: Diagnosis not present

## 2024-08-19 LAB — CBC
HCT: 46.8 % — ABNORMAL HIGH (ref 36.0–46.0)
Hemoglobin: 15.2 g/dL — ABNORMAL HIGH (ref 12.0–15.0)
MCH: 31 pg (ref 26.0–34.0)
MCHC: 32.5 g/dL (ref 30.0–36.0)
MCV: 95.3 fL (ref 80.0–100.0)
Platelets: 261 K/uL (ref 150–400)
RBC: 4.91 MIL/uL (ref 3.87–5.11)
RDW: 12.9 % (ref 11.5–15.5)
WBC: 8.9 K/uL (ref 4.0–10.5)
nRBC: 0 % (ref 0.0–0.2)

## 2024-08-19 LAB — COMPREHENSIVE METABOLIC PANEL WITH GFR
ALT: 9 U/L (ref 0–44)
AST: 21 U/L (ref 15–41)
Albumin: 3.2 g/dL — ABNORMAL LOW (ref 3.5–5.0)
Alkaline Phosphatase: 76 U/L (ref 38–126)
Anion gap: 14 (ref 5–15)
BUN: 35 mg/dL — ABNORMAL HIGH (ref 8–23)
CO2: 31 mmol/L (ref 22–32)
Calcium: 9.1 mg/dL (ref 8.9–10.3)
Chloride: 96 mmol/L — ABNORMAL LOW (ref 98–111)
Creatinine, Ser: 1 mg/dL (ref 0.44–1.00)
GFR, Estimated: 54 mL/min — ABNORMAL LOW
Glucose, Bld: 77 mg/dL (ref 70–99)
Potassium: 3.4 mmol/L — ABNORMAL LOW (ref 3.5–5.1)
Sodium: 141 mmol/L (ref 135–145)
Total Bilirubin: 1.1 mg/dL (ref 0.0–1.2)
Total Protein: 6.1 g/dL — ABNORMAL LOW (ref 6.5–8.1)

## 2024-08-19 LAB — CEA: CEA: 8.3 ng/mL — ABNORMAL HIGH (ref 0.0–4.7)

## 2024-08-19 MED ORDER — ENOXAPARIN SODIUM 40 MG/0.4ML IJ SOSY
40.0000 mg | PREFILLED_SYRINGE | INTRAMUSCULAR | Status: DC
  Administered 2024-08-20: 40 mg via SUBCUTANEOUS
  Filled 2024-08-19: qty 0.4

## 2024-08-19 MED ORDER — POTASSIUM CHLORIDE IN NACL 40-0.9 MEQ/L-% IV SOLN
INTRAVENOUS | Status: AC
  Filled 2024-08-19 (×4): qty 1000

## 2024-08-19 MED ORDER — POTASSIUM CHLORIDE 10 MEQ/100ML IV SOLN
10.0000 meq | INTRAVENOUS | Status: AC
  Administered 2024-08-19 (×4): 10 meq via INTRAVENOUS
  Filled 2024-08-19 (×3): qty 100

## 2024-08-19 NOTE — Progress Notes (Signed)
 " PROGRESS NOTE    Tracy Blackwell  FMW:[id number] DOB: 09-30-1933 DOA: 08/16/2024 PCP: Chrystal Lamarr RAMAN, MD  Brief Narrative:  89 y.o. female with medical history significant of rectal cancer, hypertension who presents emergency department with nausea vomiting decreased appetite.  Patient has a prior history of colon resection.  She was found to have a distal small bowel obstruction with enlarging presacral soft tissue mass.  She was admitted for the same.   Assessment & Plan:   Principal Problem:   Small bowel obstruction (HCC) Active Problems:   Partial small bowel obstruction (HCC)  # 1 Small bowel obstruction secondary to recurrent colorectal cancer. CT abdomen on the day of admission- Findings compatible with mid to distal small bowel obstruction. Enlarging presacral soft tissue mass is concerning for tumor recurrence and may be the etiology of the small bowel obstruction and right hydronephrosis. Postoperative changes in the rectosigmoid colon with fullness noted in the colon adjacent to the suture line cannot exclude tumor recurrence. Diffuse coronary artery and aortoiliac atherosclerosis. Dr. Dianna did a flexible sigmoidoscopy 08/18/2024-fungating partially obstructing large mass in the rectosigmoid region at 10 cm proximal to the anus.   KUB 08/18/2024 shows persistent small bowel obstruction KUB from 08/19/2024 done this morning official reading still pending surgery clamping the tube and trying clear liquids Surgery GI and oncology following. Follow-up biopsy  CT Chest no evidence of mets # AKI-resolved with IV fluids  # Hypovolemic hyponatremia-resolved sodium 141 # Hypertension-patient is not on home antihypertensives.  Will continue as needed hydralazine .  Consider starting oral agents once NG tube is out if blood pressure remains elevated.  # Hypokalemia replete aggressively recheck labs in AM.  Estimated body mass index is 28.87 kg/m as calculated from the  following:   Height as of this encounter: 5' 2 (1.575 m).   Weight as of this encounter: 71.6 kg.  DVT prophylaxis: Lovenox  Code Status: Full code Family Communication: None at bedside Disposition Plan:  Status is: Inpatient   Consultants: gi surgery  Procedures: Flex sig 08/18/2024 Antimicrobials: Zosyn Subjective:  NG tube in place and she denies any abdominal pain or nausea she reports having a bowel movement  Objective: Vitals:   08/18/24 2048 08/19/24 0503 08/19/24 0534 08/19/24 1337  BP: (!) 144/81 (!) 199/99 (!) 156/60 (!) 155/81  Pulse: 81 75 62 69  Resp: 14 16  18   Temp: 98.5 F (36.9 C) (!) 97.3 F (36.3 C)  (!) 97.5 F (36.4 C)  TempSrc: Oral Oral  Oral  SpO2: 96% 97%  97%  Weight:      Height:        Intake/Output Summary (Last 24 hours) at 08/19/2024 1653 Last data filed at 08/19/2024 1500 Gross per 24 hour  Intake 2512.63 ml  Output 1100 ml  Net 1412.63 ml   Filed Weights   08/17/24 0152  Weight: 71.6 kg    Examination:  General exam: Appears in no acute distress Respiratory system: Clear to auscultation. Respiratory effort normal. Cardiovascular system: reg Gastrointestinal system: Abdomen is nondistended, soft and nontender. No organomegaly or masses felt. Normal bowel sounds heard. Central nervous system: Alert and oriented. No focal neurological deficits. Extremities: No edema    Data Reviewed: I have personally reviewed following labs and imaging studies  CBC: Recent Labs  Lab 08/16/24 1615 08/17/24 0611 08/19/24 0557  WBC 8.5 7.9 8.9  HGB 17.5* 16.2* 15.2*  HCT 50.2* 47.5* 46.8*  MCV 90.0 90.5 95.3  PLT 364 309 261  Basic Metabolic Panel: Recent Labs  Lab 08/16/24 1615 08/17/24 0611 08/18/24 0028 08/19/24 0557  NA 130* 134* 136 141  K 3.8 2.8* 2.8* 3.4*  CL 81* 85* 88* 96*  CO2 31 35* 35* 31  GLUCOSE 139* 98 114* 77  BUN 48* 47* 47* 35*  CREATININE 1.81* 1.73* 1.30* 1.00  CALCIUM 10.5* 9.4 9.0 9.1  MG  --   --  2.6*   --    GFR: Estimated Creatinine Clearance: 36 mL/min (by C-G formula based on SCr of 1 mg/dL). Liver Function Tests: Recent Labs  Lab 08/16/24 1615 08/18/24 0028 08/19/24 0557  AST 25 34 21  ALT 9 13 9   ALKPHOS 100 84 76  BILITOT 0.8 0.9 1.1  PROT 8.1 6.8 6.1*  ALBUMIN 4.8 3.9 3.2*   Recent Labs  Lab 08/16/24 1615  LIPASE 96*   No results for input(s): AMMONIA in the last 168 hours. Coagulation Profile: Recent Labs  Lab 08/17/24 0611 08/18/24 0028  INR 0.9 1.0   Cardiac Enzymes: No results for input(s): CKTOTAL, CKMB, CKMBINDEX, TROPONINI in the last 168 hours. BNP (last 3 results) No results for input(s): PROBNP in the last 8760 hours. HbA1C: No results for input(s): HGBA1C in the last 72 hours. CBG: No results for input(s): GLUCAP in the last 168 hours. Lipid Profile: No results for input(s): CHOL, HDL, LDLCALC, TRIG, CHOLHDL, LDLDIRECT in the last 72 hours. Thyroid  Function Tests: No results for input(s): TSH, T4TOTAL, FREET4, T3FREE, THYROIDAB in the last 72 hours. Anemia Panel: No results for input(s): VITAMINB12, FOLATE, FERRITIN, TIBC, IRON, RETICCTPCT in the last 72 hours. Sepsis Labs: No results for input(s): PROCALCITON, LATICACIDVEN in the last 168 hours.  No results found for this or any previous visit (from the past 240 hours).       Radiology Studies: CT CHEST W CONTRAST Result Date: 08/18/2024 CLINICAL DATA:  Recurrent colon cancer.  * Tracking Code: BO * EXAM: CT CHEST WITH CONTRAST TECHNIQUE: Multidetector CT imaging of the chest was performed during intravenous contrast administration. RADIATION DOSE REDUCTION: This exam was performed according to the departmental dose-optimization program which includes automated exposure control, adjustment of the mA and/or kV according to patient size and/or use of iterative reconstruction technique. CONTRAST:  75mL OMNIPAQUE  IOHEXOL  300 MG/ML  SOLN  COMPARISON:  CT chest dated 06/07/2022 FINDINGS: Cardiovascular: Normal heart size. No significant pericardial fluid/thickening. Dilated aortic arch measures 3.7 cm distally. Dilated descending thoracic aorta measures 3.9 cm. No central pulmonary emboli. Coronary artery calcifications and aortic atherosclerosis. Mediastinum/Nodes: Imaged thyroid  gland without nodules meeting criteria for imaging follow-up by size. Normal esophagus. No pathologically enlarged axillary, supraclavicular, mediastinal, or hilar lymph nodes. Lungs/Pleura: The central airways are patent. Lingular and bilateral lower lobe subsegmental atelectasis. Unchanged 4 mm right lower lobe nodule (3:104) and 3 mm subpleural left upper lobe nodule (3:50). No new or enlarging pulmonary nodules. No pneumothorax. No pleural effusion. Upper abdomen: Enteric tube terminates in the gastric body. Stomach is diffusely underdistended. Unchanged mild right hydronephrosis. Multiple bilateral simple-appearing renal cysts. Musculoskeletal: No acute or abnormal lytic or blastic osseous lesions. Severe degenerative changes of the bilateral shoulders. Bilateral shoulder joint effusions. Multilevel degenerative changes of the partially imaged thoracic and lumbar spine. IMPRESSION: 1. No evidence of metastatic disease in the chest. 2. Unchanged 4 mm right lower lobe and 3 mm left upper lobe pulmonary nodules. No new or enlarging pulmonary nodules. 3. Dilated aortic arch measures 3.7 cm. Descending thoracic aorta measures 3.9 cm. Recommend annual imaging  followup by CTA or MRA. This recommendation follows 2010 ACCF/AHA/AATS/ACR/ASA/SCA/SCAI/SIR/STS/SVM Guidelines for the Diagnosis and Management of Patients with Thoracic Aortic Disease. Circulation.2010; 121: Z733-z630. Aortic aneurysm NOS (ICD10-I71.9) 4. Unchanged mild right hydronephrosis. 5. Aortic Atherosclerosis (ICD10-I70.0). Coronary artery calcifications. Assessment for potential risk factor modification,  dietary therapy or pharmacologic therapy may be warranted, if clinically indicated. Electronically Signed   By: Limin  Xu M.D.   On: 08/18/2024 18:00   DG Abd 1 View Result Date: 08/18/2024 EXAM: 1 VIEW XRAY OF THE ABDOMEN 08/18/2024 12:40:30 PM COMPARISON: 08/17/2024 CLINICAL HISTORY: SBO (small bowel obstruction) (HCC) FINDINGS: LINES, TUBES AND DEVICES: Enteric tube in place with tip and side port overlying the expected region of the gastric lumen. BOWEL: Bowel suture staples in pelvis. Dilute enteric contrast is seen within the small bowel. Dense stool in the right colon as seen on CT 08/16/2024 limits assessment for progression of enteric contrast. Persistent small bowel dilation. SOFT TISSUES: No abnormal calcifications. BONES: Degenerative changes of the lumbar spine. No acute fracture. IMPRESSION: 1. Persistent small bowel obstruction; limited assessment for progression of enteric contrast due to dense stool in the colon . Electronically signed by: Norman Gatlin MD 08/18/2024 12:49 PM EST RP Workstation: [id number]    Scheduled Meds:  diatrizoate  meglumine -sodium  90 mL Per NG tube Once   [START ON 08/20/2024] enoxaparin  (LOVENOX ) injection  40 mg Subcutaneous Q24H   Continuous Infusions:     LOS: 2 days    Tracy KANDICE Hoots, MD 08/19/2024, 4:53 PM   "

## 2024-08-19 NOTE — Progress Notes (Signed)
 Pt pulled  out her NG tube  .Dr Will notified. No new orders received.

## 2024-08-19 NOTE — Progress Notes (Signed)
 1 Day Post-Op   Subjective/Chief Complaint: No complaints. Passing flatus   Objective: Vital signs in last 24 hours: Temp:  [97.3 F (36.3 C)-98.5 F (36.9 C)] 97.3 F (36.3 C) (01/05 0503) Pulse Rate:  [62-83] 62 (01/05 0534) Resp:  [14-18] 16 (01/05 0503) BP: (133-199)/(60-99) 156/60 (01/05 0534) SpO2:  [95 %-97 %] 97 % (01/05 0503) Last BM Date : 08/18/24  Intake/Output from previous day: 01/04 0701 - 01/05 0700 In: 1116.5 [I.V.:1026.5; NG/GT:90] Out: 700 [Emesis/NG output:700] Intake/Output this shift: No intake/output data recorded.  General appearance: alert and cooperative Resp: clear to auscultation bilaterally Cardio: regular rate and rhythm GI: soft, nontender. Not distended  Lab Results:  Recent Labs    08/17/24 0611 08/19/24 0557  WBC 7.9 8.9  HGB 16.2* 15.2*  HCT 47.5* 46.8*  PLT 309 261   BMET Recent Labs    08/18/24 0028 08/19/24 0557  NA 136 141  K 2.8* 3.4*  CL 88* 96*  CO2 35* 31  GLUCOSE 114* 77  BUN 47* 35*  CREATININE 1.30* 1.00  CALCIUM 9.0 9.1   PT/INR Recent Labs    08/17/24 0611 08/18/24 0028  LABPROT 12.7 13.3  INR 0.9 1.0   ABG No results for input(s): PHART, HCO3 in the last 72 hours.  Invalid input(s): PCO2, PO2  Studies/Results: CT CHEST W CONTRAST Result Date: 08/18/2024 CLINICAL DATA:  Recurrent colon cancer.  * Tracking Code: BO * EXAM: CT CHEST WITH CONTRAST TECHNIQUE: Multidetector CT imaging of the chest was performed during intravenous contrast administration. RADIATION DOSE REDUCTION: This exam was performed according to the departmental dose-optimization program which includes automated exposure control, adjustment of the mA and/or kV according to patient size and/or use of iterative reconstruction technique. CONTRAST:  75mL OMNIPAQUE  IOHEXOL  300 MG/ML  SOLN COMPARISON:  CT chest dated 06/07/2022 FINDINGS: Cardiovascular: Normal heart size. No significant pericardial fluid/thickening. Dilated aortic  arch measures 3.7 cm distally. Dilated descending thoracic aorta measures 3.9 cm. No central pulmonary emboli. Coronary artery calcifications and aortic atherosclerosis. Mediastinum/Nodes: Imaged thyroid  gland without nodules meeting criteria for imaging follow-up by size. Normal esophagus. No pathologically enlarged axillary, supraclavicular, mediastinal, or hilar lymph nodes. Lungs/Pleura: The central airways are patent. Lingular and bilateral lower lobe subsegmental atelectasis. Unchanged 4 mm right lower lobe nodule (3:104) and 3 mm subpleural left upper lobe nodule (3:50). No new or enlarging pulmonary nodules. No pneumothorax. No pleural effusion. Upper abdomen: Enteric tube terminates in the gastric body. Stomach is diffusely underdistended. Unchanged mild right hydronephrosis. Multiple bilateral simple-appearing renal cysts. Musculoskeletal: No acute or abnormal lytic or blastic osseous lesions. Severe degenerative changes of the bilateral shoulders. Bilateral shoulder joint effusions. Multilevel degenerative changes of the partially imaged thoracic and lumbar spine. IMPRESSION: 1. No evidence of metastatic disease in the chest. 2. Unchanged 4 mm right lower lobe and 3 mm left upper lobe pulmonary nodules. No new or enlarging pulmonary nodules. 3. Dilated aortic arch measures 3.7 cm. Descending thoracic aorta measures 3.9 cm. Recommend annual imaging followup by CTA or MRA. This recommendation follows 2010 ACCF/AHA/AATS/ACR/ASA/SCA/SCAI/SIR/STS/SVM Guidelines for the Diagnosis and Management of Patients with Thoracic Aortic Disease. Circulation.2010; 121: Z733-z630. Aortic aneurysm NOS (ICD10-I71.9) 4. Unchanged mild right hydronephrosis. 5. Aortic Atherosclerosis (ICD10-I70.0). Coronary artery calcifications. Assessment for potential risk factor modification, dietary therapy or pharmacologic therapy may be warranted, if clinically indicated. Electronically Signed   By: Limin  Xu M.D.   On: 08/18/2024 18:00    DG Abd 1 View Result Date: 08/18/2024 EXAM: 1 VIEW XRAY  OF THE ABDOMEN 08/18/2024 12:40:30 PM COMPARISON: 08/17/2024 CLINICAL HISTORY: SBO (small bowel obstruction) (HCC) FINDINGS: LINES, TUBES AND DEVICES: Enteric tube in place with tip and side port overlying the expected region of the gastric lumen. BOWEL: Bowel suture staples in pelvis. Dilute enteric contrast is seen within the small bowel. Dense stool in the right colon as seen on CT 08/16/2024 limits assessment for progression of enteric contrast. Persistent small bowel dilation. SOFT TISSUES: No abnormal calcifications. BONES: Degenerative changes of the lumbar spine. No acute fracture. IMPRESSION: 1. Persistent small bowel obstruction; limited assessment for progression of enteric contrast due to dense stool in the colon . Electronically signed by: Norman Gatlin MD 08/18/2024 12:49 PM EST RP Workstation: [id number]    Anti-infectives: Anti-infectives (From admission, onward)    None       Assessment/Plan: s/p Procedures: SIGMOIDOSCOPY, FLEXIBLE (N/A) BIOPSY, GI Sbo seems to be resolving Clamp ng and allow clears. If she tolerates this then will remove ng and advance diet History of recurrent colorectal cancer Presacral mass Possible tumor recurrence at colorectal anastomosis Small bowel obstruction likely due to presacral mass Mild right hydronephrosis AKI   -for flex sig yesterday to evaluate possible tumor recurrence -small bowel protocol to follow to r/o SBO -medical oncology will need to see for their opinion  LOS: 2 days    Deward Null III 08/19/2024

## 2024-08-19 NOTE — Progress Notes (Signed)
 Tracy Blackwell   DOB:[birth year]   FM#:[id number]    ASSESSMENT & PLAN:   Recurrent colorectal cancer causing small bowel obstruction The patient has recurrent colorectal cancer causing small bowel obstruction CT imaging of the chest showed no evidence of metastatic disease in her chest At this point, she needs surgical input for either diversion colostomy versus definitive surgery/resection for recurrent cancer Repeat biopsy is pending, given the appearance and her presentation, this is recurrent cancer until proven otherwise CEA is mildly elevated  Severe electrolyte imbalance Acute renal failure, resolving Continue IV fluid support Continue to trend her CMP  Elevated hemoglobin Likely a sign of dehydration  Discharge planning It is unlikely she will be discharged over the next 2 to 3 days  Tracy Bedford, MD 08/19/2024 9:12 AM  Subjective:  Patient was asleep when I entered the room.  Labs and CT imaging was reviewed.  NG tube is still in place  Objective:  Vitals:   08/19/24 0503 08/19/24 0534  BP: (!) 199/99 (!) 156/60  Pulse: 75 62  Resp: 16   Temp: (!) 97.3 F (36.3 C)   SpO2: 97%      Intake/Output Summary (Last 24 hours) at 08/19/2024 0912 Last data filed at 08/19/2024 0304 Gross per 24 hour  Intake 1016.51 ml  Output 700 ml  Net 316.51 ml

## 2024-08-20 DIAGNOSIS — K56609 Unspecified intestinal obstruction, unspecified as to partial versus complete obstruction: Secondary | ICD-10-CM | POA: Diagnosis not present

## 2024-08-20 LAB — SURGICAL PATHOLOGY

## 2024-08-20 MED ORDER — ACETAMINOPHEN 325 MG PO TABS: 650.0000 mg | ORAL_TABLET | Freq: Four times a day (QID) | ORAL | Status: AC | PRN

## 2024-08-20 MED ORDER — ONDANSETRON HCL 4 MG PO TABS: 4.0000 mg | ORAL_TABLET | Freq: Four times a day (QID) | ORAL | 0 refills | Status: AC | PRN

## 2024-08-20 NOTE — Progress Notes (Signed)
" °   08/20/24 0852  TOC Brief Assessment  Insurance and Status Reviewed  Patient has primary care physician Yes Estevan, Lamarr RAMAN, MD)  Home environment has been reviewed From home  Prior level of function: Independent  Prior/Current Home Services No current home services  Social Drivers of Health Review SDOH reviewed no interventions necessary  Readmission risk has been reviewed Yes  Transition of care needs no transition of care needs at this time    "

## 2024-08-20 NOTE — Progress Notes (Addendum)
 " PROGRESS NOTE    Tracy Blackwell  FMW:[id number] DOB: June 10, 1934 DOA: 08/16/2024 PCP: Chrystal Lamarr RAMAN, MD  Brief Narrative:  89 y.o. female with medical history significant of rectal cancer, hypertension who presents emergency department with nausea vomiting decreased appetite.  Patient has a prior history of colon resection.  She was found to have a distal small bowel obstruction with enlarging presacral soft tissue mass.  She was admitted for the same.   Assessment & Plan:   Principal Problem:   Small bowel obstruction (HCC) Active Problems:   Partial small bowel obstruction (HCC)  # 1 Small bowel obstruction secondary to recurrent colorectal cancer. CT abdomen on the day of admission- Findings compatible with mid to distal small bowel obstruction. Enlarging presacral soft tissue mass is concerning for tumor recurrence and may be the etiology of the small bowel obstruction and right hydronephrosis. Postoperative changes in the rectosigmoid colon with fullness noted in the colon adjacent to the suture line cannot exclude tumor recurrence. Diffuse coronary artery and aortoiliac atherosclerosis. Dr. Dianna did a flexible sigmoidoscopy 08/18/2024-fungating partially obstructing large mass in the rectosigmoid region at 10 cm proximal to the anus.   KUB 08/18/2024 shows persistent small bowel obstruction KUB from 08/19/2024 no evidence of bowel obstruction  NG tube was pulled out by patient on 08/19/2024. Surgery advancing diet plan DC home when she is able to tolerate diet. Surgery GI and oncology following.  Follow-up with Dr. Cloretta on discharge.  Follow-up with Dr. Teresa on discharge. Follow-up biopsy  CT Chest no evidence of mets # AKI-resolved with IV fluids  # Hypovolemic hyponatremia-resolved sodium 141 # Hypertension-patient is not on home antihypertensives.  Will continue as needed hydralazine .  Consider starting oral agents once NG tube is out if blood pressure remains  elevated.  # Hypokalemia replete aggressively recheck labs in AM.  Estimated body mass index is 28.87 kg/m as calculated from the following:   Height as of this encounter: 5' 2 (1.575 m).   Weight as of this encounter: 71.6 kg.  DVT prophylaxis: Lovenox  Code Status: Full code Family Communication: None at bedside Disposition Plan:  Status is: Inpatient   Consultants: gi surgery  Procedures: Flex sig 08/18/2024 Antimicrobials: None  subjective:  NG tube in place and she denies any abdominal pain or nausea she reports having a bowel movement  Objective: Vitals:   08/19/24 1337 08/19/24 2036 08/20/24 0444 08/20/24 1500  BP: (!) 155/81 (!) 166/70 (!) 165/86 (!) 150/67  Pulse: 69 79 80 74  Resp: 18 14 14 20   Temp: (!) 97.5 F (36.4 C) 98.4 F (36.9 C) 98.6 F (37 C) 98.6 F (37 C)  TempSrc: Oral Oral Oral Oral  SpO2: 97% 99% 98% 97%  Weight:      Height:        Intake/Output Summary (Last 24 hours) at 08/20/2024 1546 Last data filed at 08/20/2024 0300 Gross per 24 hour  Intake 1050.33 ml  Output --  Net 1050.33 ml   Filed Weights   08/17/24 0152  Weight: 71.6 kg    Examination:  General exam: Appears in no acute distress Respiratory system: Clear to auscultation. Respiratory effort normal. Cardiovascular system: reg Gastrointestinal system: Abdomen is nondistended, soft and nontender. No organomegaly or masses felt. Normal bowel sounds heard. Central nervous system: Alert and oriented. No focal neurological deficits. Extremities: No edema    Data Reviewed: I have personally reviewed following labs and imaging studies  CBC: Recent Labs  Lab 08/16/24 1615 08/17/24  9388 08/19/24 0557  WBC 8.5 7.9 8.9  HGB 17.5* 16.2* 15.2*  HCT 50.2* 47.5* 46.8*  MCV 90.0 90.5 95.3  PLT 364 309 261   Basic Metabolic Panel: Recent Labs  Lab 08/16/24 1615 08/17/24 0611 08/18/24 0028 08/19/24 0557  NA 130* 134* 136 141  K 3.8 2.8* 2.8* 3.4*  CL 81* 85* 88* 96*  CO2  31 35* 35* 31  GLUCOSE 139* 98 114* 77  BUN 48* 47* 47* 35*  CREATININE 1.81* 1.73* 1.30* 1.00  CALCIUM 10.5* 9.4 9.0 9.1  MG  --   --  2.6*  --    GFR: Estimated Creatinine Clearance: 36 mL/min (by C-G formula based on SCr of 1 mg/dL). Liver Function Tests: Recent Labs  Lab 08/16/24 1615 08/18/24 0028 08/19/24 0557  AST 25 34 21  ALT 9 13 9   ALKPHOS 100 84 76  BILITOT 0.8 0.9 1.1  PROT 8.1 6.8 6.1*  ALBUMIN 4.8 3.9 3.2*   Recent Labs  Lab 08/16/24 1615  LIPASE 96*   No results for input(s): AMMONIA in the last 168 hours. Coagulation Profile: Recent Labs  Lab 08/17/24 0611 08/18/24 0028  INR 0.9 1.0   Cardiac Enzymes: No results for input(s): CKTOTAL, CKMB, CKMBINDEX, TROPONINI in the last 168 hours. BNP (last 3 results) No results for input(s): PROBNP in the last 8760 hours. HbA1C: No results for input(s): HGBA1C in the last 72 hours. CBG: No results for input(s): GLUCAP in the last 168 hours. Lipid Profile: No results for input(s): CHOL, HDL, LDLCALC, TRIG, CHOLHDL, LDLDIRECT in the last 72 hours. Thyroid  Function Tests: No results for input(s): TSH, T4TOTAL, FREET4, T3FREE, THYROIDAB in the last 72 hours. Anemia Panel: No results for input(s): VITAMINB12, FOLATE, FERRITIN, TIBC, IRON, RETICCTPCT in the last 72 hours. Sepsis Labs: No results for input(s): PROCALCITON, LATICACIDVEN in the last 168 hours.  No results found for this or any previous visit (from the past 240 hours).       Radiology Studies: DG Abd 1 View Result Date: 08/19/2024 CLINICAL DATA:  Follow-up small bowel obstruction. EXAM: ABDOMEN - 1 VIEW COMPARISON:  08/18/2024 radiographs and CT. FINDINGS: Normal bowel gas pattern without free peritoneal air. Retained contrast in the right renal collecting system, right ureter and urinary bladder. Moderate right hydronephrosis and proximal right hydroureter with mild progression. Normal caliber  distal right ureter. Unremarkable urinary bladder. Nasogastric tube tip in the mid stomach and side hole in the proximal stomach. Lumbar and lower thoracic spine degenerative changes. IMPRESSION: 1. No evidence of bowel obstruction. 2. Moderate right hydronephrosis and proximal right hydroureter with mild progression. Electronically Signed   By: Elspeth Bathe M.D.   On: 08/19/2024 17:09   CT CHEST W CONTRAST Result Date: 08/18/2024 CLINICAL DATA:  Recurrent colon cancer.  * Tracking Code: BO * EXAM: CT CHEST WITH CONTRAST TECHNIQUE: Multidetector CT imaging of the chest was performed during intravenous contrast administration. RADIATION DOSE REDUCTION: This exam was performed according to the departmental dose-optimization program which includes automated exposure control, adjustment of the mA and/or kV according to patient size and/or use of iterative reconstruction technique. CONTRAST:  75mL OMNIPAQUE  IOHEXOL  300 MG/ML  SOLN COMPARISON:  CT chest dated 06/07/2022 FINDINGS: Cardiovascular: Normal heart size. No significant pericardial fluid/thickening. Dilated aortic arch measures 3.7 cm distally. Dilated descending thoracic aorta measures 3.9 cm. No central pulmonary emboli. Coronary artery calcifications and aortic atherosclerosis. Mediastinum/Nodes: Imaged thyroid  gland without nodules meeting criteria for imaging follow-up by size. Normal esophagus. No pathologically  enlarged axillary, supraclavicular, mediastinal, or hilar lymph nodes. Lungs/Pleura: The central airways are patent. Lingular and bilateral lower lobe subsegmental atelectasis. Unchanged 4 mm right lower lobe nodule (3:104) and 3 mm subpleural left upper lobe nodule (3:50). No new or enlarging pulmonary nodules. No pneumothorax. No pleural effusion. Upper abdomen: Enteric tube terminates in the gastric body. Stomach is diffusely underdistended. Unchanged mild right hydronephrosis. Multiple bilateral simple-appearing renal cysts. Musculoskeletal:  No acute or abnormal lytic or blastic osseous lesions. Severe degenerative changes of the bilateral shoulders. Bilateral shoulder joint effusions. Multilevel degenerative changes of the partially imaged thoracic and lumbar spine. IMPRESSION: 1. No evidence of metastatic disease in the chest. 2. Unchanged 4 mm right lower lobe and 3 mm left upper lobe pulmonary nodules. No new or enlarging pulmonary nodules. 3. Dilated aortic arch measures 3.7 cm. Descending thoracic aorta measures 3.9 cm. Recommend annual imaging followup by CTA or MRA. This recommendation follows 2010 ACCF/AHA/AATS/ACR/ASA/SCA/SCAI/SIR/STS/SVM Guidelines for the Diagnosis and Management of Patients with Thoracic Aortic Disease. Circulation.2010; 121: Z733-z630. Aortic aneurysm NOS (ICD10-I71.9) 4. Unchanged mild right hydronephrosis. 5. Aortic Atherosclerosis (ICD10-I70.0). Coronary artery calcifications. Assessment for potential risk factor modification, dietary therapy or pharmacologic therapy may be warranted, if clinically indicated. Electronically Signed   By: Limin  Xu M.D.   On: 08/18/2024 18:00    Scheduled Meds:  diatrizoate  meglumine -sodium  90 mL Per NG tube Once   enoxaparin  (LOVENOX ) injection  40 mg Subcutaneous Q24H   Continuous Infusions:  0.9 % NaCl with KCl 40 mEq / L 75 mL/hr at 08/20/24 1537      LOS: 3 days    Almarie KANDICE Hoots, MD 08/20/2024, 3:46 PM   "

## 2024-08-20 NOTE — Progress Notes (Signed)
 2 Days Post-Op   Subjective/Chief Complaint: No complaints. Tolerated clears. Had 2 bm yesterday   Objective: Vital signs in last 24 hours: Temp:  [97.5 F (36.4 C)-98.6 F (37 C)] 98.6 F (37 C) (01/06 0444) Pulse Rate:  [69-80] 80 (01/06 0444) Resp:  [14-18] 14 (01/06 0444) BP: (155-166)/(70-86) 165/86 (01/06 0444) SpO2:  [97 %-99 %] 98 % (01/06 0444) Last BM Date : 08/20/24  Intake/Output from previous day: 01/05 0701 - 01/06 0700 In: 2730.3 [P.O.:2160; I.V.:570.3] Out: 700 [Emesis/NG output:700] Intake/Output this shift: No intake/output data recorded.  General appearance: alert and cooperative Resp: clear to auscultation bilaterally Cardio: regular rate and rhythm GI: soft, nontender  Lab Results:  Recent Labs    08/19/24 0557  WBC 8.9  HGB 15.2*  HCT 46.8*  PLT 261   BMET Recent Labs    08/18/24 0028 08/19/24 0557  NA 136 141  K 2.8* 3.4*  CL 88* 96*  CO2 35* 31  GLUCOSE 114* 77  BUN 47* 35*  CREATININE 1.30* 1.00  CALCIUM 9.0 9.1   PT/INR Recent Labs    08/18/24 0028  LABPROT 13.3  INR 1.0   ABG No results for input(s): PHART, HCO3 in the last 72 hours.  Invalid input(s): PCO2, PO2  Studies/Results: DG Abd 1 View Result Date: 08/19/2024 CLINICAL DATA:  Follow-up small bowel obstruction. EXAM: ABDOMEN - 1 VIEW COMPARISON:  08/18/2024 radiographs and CT. FINDINGS: Normal bowel gas pattern without free peritoneal air. Retained contrast in the right renal collecting system, right ureter and urinary bladder. Moderate right hydronephrosis and proximal right hydroureter with mild progression. Normal caliber distal right ureter. Unremarkable urinary bladder. Nasogastric tube tip in the mid stomach and side hole in the proximal stomach. Lumbar and lower thoracic spine degenerative changes. IMPRESSION: 1. No evidence of bowel obstruction. 2. Moderate right hydronephrosis and proximal right hydroureter with mild progression. Electronically Signed    By: Elspeth Bathe M.D.   On: 08/19/2024 17:09   CT CHEST W CONTRAST Result Date: 08/18/2024 CLINICAL DATA:  Recurrent colon cancer.  * Tracking Code: BO * EXAM: CT CHEST WITH CONTRAST TECHNIQUE: Multidetector CT imaging of the chest was performed during intravenous contrast administration. RADIATION DOSE REDUCTION: This exam was performed according to the departmental dose-optimization program which includes automated exposure control, adjustment of the mA and/or kV according to patient size and/or use of iterative reconstruction technique. CONTRAST:  75mL OMNIPAQUE  IOHEXOL  300 MG/ML  SOLN COMPARISON:  CT chest dated 06/07/2022 FINDINGS: Cardiovascular: Normal heart size. No significant pericardial fluid/thickening. Dilated aortic arch measures 3.7 cm distally. Dilated descending thoracic aorta measures 3.9 cm. No central pulmonary emboli. Coronary artery calcifications and aortic atherosclerosis. Mediastinum/Nodes: Imaged thyroid  gland without nodules meeting criteria for imaging follow-up by size. Normal esophagus. No pathologically enlarged axillary, supraclavicular, mediastinal, or hilar lymph nodes. Lungs/Pleura: The central airways are patent. Lingular and bilateral lower lobe subsegmental atelectasis. Unchanged 4 mm right lower lobe nodule (3:104) and 3 mm subpleural left upper lobe nodule (3:50). No new or enlarging pulmonary nodules. No pneumothorax. No pleural effusion. Upper abdomen: Enteric tube terminates in the gastric body. Stomach is diffusely underdistended. Unchanged mild right hydronephrosis. Multiple bilateral simple-appearing renal cysts. Musculoskeletal: No acute or abnormal lytic or blastic osseous lesions. Severe degenerative changes of the bilateral shoulders. Bilateral shoulder joint effusions. Multilevel degenerative changes of the partially imaged thoracic and lumbar spine. IMPRESSION: 1. No evidence of metastatic disease in the chest. 2. Unchanged 4 mm right lower lobe and 3 mm left  upper lobe pulmonary nodules. No new or enlarging pulmonary nodules. 3. Dilated aortic arch measures 3.7 cm. Descending thoracic aorta measures 3.9 cm. Recommend annual imaging followup by CTA or MRA. This recommendation follows 2010 ACCF/AHA/AATS/ACR/ASA/SCA/SCAI/SIR/STS/SVM Guidelines for the Diagnosis and Management of Patients with Thoracic Aortic Disease. Circulation.2010; 121: Z733-z630. Aortic aneurysm NOS (ICD10-I71.9) 4. Unchanged mild right hydronephrosis. 5. Aortic Atherosclerosis (ICD10-I70.0). Coronary artery calcifications. Assessment for potential risk factor modification, dietary therapy or pharmacologic therapy may be warranted, if clinically indicated. Electronically Signed   By: Limin  Xu M.D.   On: 08/18/2024 18:00   DG Abd 1 View Result Date: 08/18/2024 EXAM: 1 VIEW XRAY OF THE ABDOMEN 08/18/2024 12:40:30 PM COMPARISON: 08/17/2024 CLINICAL HISTORY: SBO (small bowel obstruction) (HCC) FINDINGS: LINES, TUBES AND DEVICES: Enteric tube in place with tip and side port overlying the expected region of the gastric lumen. BOWEL: Bowel suture staples in pelvis. Dilute enteric contrast is seen within the small bowel. Dense stool in the right colon as seen on CT 08/16/2024 limits assessment for progression of enteric contrast. Persistent small bowel dilation. SOFT TISSUES: No abnormal calcifications. BONES: Degenerative changes of the lumbar spine. No acute fracture. IMPRESSION: 1. Persistent small bowel obstruction; limited assessment for progression of enteric contrast due to dense stool in the colon . Electronically signed by: Norman Gatlin MD 08/18/2024 12:49 PM EST RP Workstation: [id number]    Anti-infectives: Anti-infectives (From admission, onward)    None       Assessment/Plan: s/p Procedures: SIGMOIDOSCOPY, FLEXIBLE (N/A) BIOPSY, GI Sbo seems to be resolving.  Advance diet as tolerated. Possible recurrence of her colorectal cancer. Follow up with Dr. Teresa as outpt  LOS: 3  days    Deward Null III 08/20/2024

## 2024-08-20 NOTE — Plan of Care (Signed)
   Problem: Activity: Goal: Risk for activity intolerance will decrease Outcome: Progressing   Problem: Coping: Goal: Level of anxiety will decrease Outcome: Progressing   Problem: Safety: Goal: Ability to remain free from injury will improve Outcome: Progressing   Problem: Skin Integrity: Goal: Risk for impaired skin integrity will decrease Outcome: Progressing

## 2024-08-21 ENCOUNTER — Encounter: Payer: Self-pay | Admitting: *Deleted

## 2024-08-21 ENCOUNTER — Telehealth: Payer: Self-pay | Admitting: *Deleted

## 2024-08-21 NOTE — Telephone Encounter (Signed)
 Call from daughter, Dia requesting appointment with Dr. Cloretta per her discharge instructions today. Dx: Colon Cancer. Informed daughter that she is being seen on 1/21 at 11:15 with NP.

## 2024-08-21 NOTE — Discharge Summary (Signed)
 " Physician Discharge Summary   Patient: Tracy Blackwell MRN: [id number] DOB: 09/21/1933  Admit date:     08/16/2024  Discharge date: 08/21/2024  Discharge Physician: Lonni SHAUNNA Dalton   PCP: Chrystal Lamarr RAMAN, MD     Recommendations at discharge:  Follow up with Dr. Cloretta Oncology for colon cancer Follow up with Dr. Teresa Colorectal surgery for colon cancer     Discharge Diagnoses: Principal Problem:   Small bowel obstruction due to recurrent colon cancer(HCC) Active Problems:   Recurrent colon cancer   AKI   Hyponatermia   Hypertension   Hypokalemia       Hospital Course: 89 y.o. F with HTN, colon cancer in 2018 and LAR, recurrence in 2023 and repeat LAR in 2023, who presented with SBO.   SBO due to malignancy Admitted and CT showed distal bowel obstruction with enlarging presacral soft tissue mass.  Underwent flexible sigmoidoscopy by Dr. Dianna that showed partially obstructing large rectosigmoid mass.  Biopsy confirmed recurrent adenocarcinoma.  General surgery and oncology were consulted, and recommended outpatient follow-up.  She was able to advance her diet.  She was discharged on soft diet with daily MiraLAX.  She has follow-up with general surgery pending.            The [place]  Controlled Substances Registry was reviewed for this patient prior to discharge.   Consultants: General surgery Gastroenterology Oncology Procedures performed: Expo sigmoidoscopy Disposition: Home Diet recommendation: Soft diet   DISCHARGE MEDICATION: Allergies as of 08/21/2024       Reactions   Wasp Venom Anaphylaxis   Codeine Nausea And Vomiting        Medication List     TAKE these medications    acetaminophen  325 MG tablet Commonly known as: TYLENOL  Take 2 tablets (650 mg total) by mouth every 6 (six) hours as needed for mild pain (pain score 1-3) or fever (or Fever >/= 101).   multivitamin with minerals Tabs tablet Take 1 tablet by  mouth daily.   ondansetron  4 MG tablet Commonly known as: ZOFRAN  Take 1 tablet (4 mg total) by mouth every 6 (six) hours as needed for nausea.   ZINC PO Take 1 tablet by mouth daily.        Follow-up Information     Chrystal Lamarr RAMAN, MD Follow up.   Specialty: Family Medicine Contact information: [address] [place] [address] [phone]         Teresa Lonni CHRISTELLA, MD Follow up.   Specialties: General Surgery, Colon and Rectal Surgery Contact information: [address] [place] [address] [phone]                 Discharge Instructions     Discharge instructions   Complete by: As directed    **IMPORTANT DISCHARGE INSTRUCTIONS**   From Dr. Dalton: You were admitted for a bowel obstruction  Here, you had a flexible sigmoidoscopy to see into the colon and found that this was from a mass.  Thankfully, the bowel obstruction resolved  You should follow up with Dr. Medford Teresa at [facility] (see below) His team has recommended you follow a low fiber diet for the next week and also take MiraLAX twice daily (to ensure one soft bowel movement per day) (Reduce MiraLAX to once daily if bowel movements become too frequent)   Call Dr. Andriette office from the [facility] to arrange a follow up  Go see Dr. Chrystal, your primary care,  in 1 week       Discharge Exam: Filed Weights   08/17/24 0152  Weight: 71.6 kg    General: Pt is alert, awake, not in acute distress Cardiovascular: RRR, nl S1-S2, no murmurs appreciated.   No LE edema.   Respiratory: Normal respiratory rate and rhythm.  CTAB without rales or wheezes. Abdominal: Abdomen soft and non-tender.  No distension or HSM.   Neuro/Psych: Strength symmetric in upper and lower extremities.  Judgment and insight appear normal.   Condition at discharge: good  The results of significant diagnostics from this hospitalization  (including imaging, microbiology, ancillary and laboratory) are listed below for reference.   Imaging Studies: DG Abd 1 View Result Date: 08/19/2024 CLINICAL DATA:  Follow-up small bowel obstruction. EXAM: ABDOMEN - 1 VIEW COMPARISON:  08/18/2024 radiographs and CT. FINDINGS: Normal bowel gas pattern without free peritoneal air. Retained contrast in the right renal collecting system, right ureter and urinary bladder. Moderate right hydronephrosis and proximal right hydroureter with mild progression. Normal caliber distal right ureter. Unremarkable urinary bladder. Nasogastric tube tip in the mid stomach and side hole in the proximal stomach. Lumbar and lower thoracic spine degenerative changes. IMPRESSION: 1. No evidence of bowel obstruction. 2. Moderate right hydronephrosis and proximal right hydroureter with mild progression. Electronically Signed   By: Elspeth Bathe M.D.   On: 08/19/2024 17:09   CT CHEST W CONTRAST Result Date: 08/18/2024 CLINICAL DATA:  Recurrent colon cancer.  * Tracking Code: BO * EXAM: CT CHEST WITH CONTRAST TECHNIQUE: Multidetector CT imaging of the chest was performed during intravenous contrast administration. RADIATION DOSE REDUCTION: This exam was performed according to the departmental dose-optimization program which includes automated exposure control, adjustment of the mA and/or kV according to patient size and/or use of iterative reconstruction technique. CONTRAST:  75mL OMNIPAQUE  IOHEXOL  300 MG/ML  SOLN COMPARISON:  CT chest dated 06/07/2022 FINDINGS: Cardiovascular: Normal heart size. No significant pericardial fluid/thickening. Dilated aortic arch measures 3.7 cm distally. Dilated descending thoracic aorta measures 3.9 cm. No central pulmonary emboli. Coronary artery calcifications and aortic atherosclerosis. Mediastinum/Nodes: Imaged thyroid  gland without nodules meeting criteria for imaging follow-up by size. Normal esophagus. No pathologically enlarged axillary,  supraclavicular, mediastinal, or hilar lymph nodes. Lungs/Pleura: The central airways are patent. Lingular and bilateral lower lobe subsegmental atelectasis. Unchanged 4 mm right lower lobe nodule (3:104) and 3 mm subpleural left upper lobe nodule (3:50). No new or enlarging pulmonary nodules. No pneumothorax. No pleural effusion. Upper abdomen: Enteric tube terminates in the gastric body. Stomach is diffusely underdistended. Unchanged mild right hydronephrosis. Multiple bilateral simple-appearing renal cysts. Musculoskeletal: No acute or abnormal lytic or blastic osseous lesions. Severe degenerative changes of the bilateral shoulders. Bilateral shoulder joint effusions. Multilevel degenerative changes of the partially imaged thoracic and lumbar spine. IMPRESSION: 1. No evidence of metastatic disease in the chest. 2. Unchanged 4 mm right lower lobe and 3 mm left upper lobe pulmonary nodules. No new or enlarging pulmonary nodules. 3. Dilated aortic arch measures 3.7 cm. Descending thoracic aorta measures 3.9 cm. Recommend annual imaging followup by CTA or MRA. This recommendation follows 2010 ACCF/AHA/AATS/ACR/ASA/SCA/SCAI/SIR/STS/SVM Guidelines for the Diagnosis and Management of Patients with Thoracic Aortic Disease. Circulation.2010; 121: Z733-z630. Aortic aneurysm NOS (ICD10-I71.9) 4. Unchanged mild right hydronephrosis. 5. Aortic Atherosclerosis (ICD10-I70.0). Coronary artery calcifications. Assessment for potential risk factor modification, dietary therapy or pharmacologic therapy may be warranted, if clinically indicated. Electronically Signed   By: Limin  Xu M.D.   On: 08/18/2024 18:00   DG Abd 1  View Result Date: 08/18/2024 EXAM: 1 VIEW XRAY OF THE ABDOMEN 08/18/2024 12:40:30 PM COMPARISON: 08/17/2024 CLINICAL HISTORY: SBO (small bowel obstruction) (HCC) FINDINGS: LINES, TUBES AND DEVICES: Enteric tube in place with tip and side port overlying the expected region of the gastric lumen. BOWEL: Bowel suture  staples in pelvis. Dilute enteric contrast is seen within the small bowel. Dense stool in the right colon as seen on CT 08/16/2024 limits assessment for progression of enteric contrast. Persistent small bowel dilation. SOFT TISSUES: No abnormal calcifications. BONES: Degenerative changes of the lumbar spine. No acute fracture. IMPRESSION: 1. Persistent small bowel obstruction; limited assessment for progression of enteric contrast due to dense stool in the colon . Electronically signed by: Norman Gatlin MD 08/18/2024 12:49 PM EST RP Workstation: [id number]   DG Abd 1 View Result Date: 08/17/2024 EXAM: 1 VIEW XRAY OF THE ABDOMEN 08/17/2024 12:29:00 AM COMPARISON: None available. CLINICAL HISTORY: Encounter for nasogastric (NG) tube placement. FINDINGS: LINES, TUBES AND DEVICES: NG tube the tip is in the proximal stomach with the side port near the GE junction. BOWEL: Dilated upper abdomen small bowel loops. SOFT TISSUES: No abnormal calcifications. BONES: No acute fracture. IMPRESSION: 1. Nasogastric tube tip in the proximal stomach with the side port near the gastroesophageal junction; consider advancement. Electronically signed by: Franky Crease MD 08/17/2024 12:31 AM EST RP Workstation: [id number]   CT ABDOMEN PELVIS WO CONTRAST Result Date: 08/16/2024 EXAM: CT ABDOMEN AND PELVIS WITHOUT CONTRAST 08/16/2024 09:59:37 PM TECHNIQUE: CT of the abdomen and pelvis was performed without the administration of intravenous contrast. Multiplanar reformatted images are provided for review. Automated exposure control, iterative reconstruction, and/or weight-based adjustment of the mA/kV was utilized to reduce the radiation dose to as low as reasonably achievable. COMPARISON: Prior study for comparison. CLINICAL HISTORY: Bowel obstruction suspected. FINDINGS: LOWER CHEST: Calcified visualized coronary arteries. LIVER: The liver is unremarkable. GALLBLADDER AND BILE DUCTS: Gallbladder is unremarkable. No biliary ductal  dilatation. SPLEEN: No acute abnormality. PANCREAS: No acute abnormality. ADRENAL GLANDS: No acute abnormality. KIDNEYS, URETERS AND BLADDER: Multiple bilateral renal cysts are stable. Per consensus, no follow-up is needed for simple Bosniak type 1 and 2 renal cysts, unless the patient has a malignancy history or risk factors. Mild right hydronephrosis, new since prior study. This may be related to the abnormal presacral soft tissue mass. No visible stones. No perinephric or periureteral stranding. Urinary bladder is unremarkable. GI AND BOWEL: Dilated, fluid-filled stomach and proximal to mid small bowel loops are dilated into the pelvis. Distal small bowel is decompressed. Findings are compatible with mid to distal small bowel obstruction. There are small bowel loops in the region of the presacral mass; this could be the cause of bowel obstruction. Postoperative changes in the rectosigmoid colon. Questionable recurrent mass at the suture line. Colonic diverticulosis. Normal appendix. PERITONEUM AND RETROPERITONEUM: Abnormal presacral soft tissue measures 4 x 4 cm on image 59, compared to 2.9 x 2.3 cm previously. Findings are concerning for tumor recurrence. No ascites. No free air. VASCULATURE: Calcified aorta. LYMPH NODES: No lymphadenopathy. REPRODUCTIVE ORGANS: No acute abnormality. BONES AND SOFT TISSUES: No acute osseous abnormality. No focal soft tissue abnormality. IMPRESSION: 1. Findings compatible with mid to distal small bowel obstruction. 2. Enlarging presacral soft tissue mass is concerning for tumor recurrence and may be the etiology of the small bowel obstruction and right hydronephrosis. 3. Postoperative changes in the rectosigmoid colon with fullness noted in the colon adjacent to the suture line cannot exclude tumor recurrence. 4. Diffuse coronary artery and  aortoiliac atherosclerosis. Electronically signed by: Franky Crease MD 08/16/2024 11:06 PM EST RP Workstation: [id number]     Microbiology: No results found for this or any previous visit.  Labs: CBC: Recent Labs  Lab 08/16/24 1615 08/17/24 0611 08/19/24 0557  WBC 8.5 7.9 8.9  HGB 17.5* 16.2* 15.2*  HCT 50.2* 47.5* 46.8*  MCV 90.0 90.5 95.3  PLT 364 309 261   Basic Metabolic Panel: Recent Labs  Lab 08/16/24 1615 08/17/24 0611 08/18/24 0028 08/19/24 0557  NA 130* 134* 136 141  K 3.8 2.8* 2.8* 3.4*  CL 81* 85* 88* 96*  CO2 31 35* 35* 31  GLUCOSE 139* 98 114* 77  BUN 48* 47* 47* 35*  CREATININE 1.81* 1.73* 1.30* 1.00  CALCIUM 10.5* 9.4 9.0 9.1  MG  --   --  2.6*  --    Liver Function Tests: Recent Labs  Lab 08/16/24 1615 08/18/24 0028 08/19/24 0557  AST 25 34 21  ALT 9 13 9   ALKPHOS 100 84 76  BILITOT 0.8 0.9 1.1  PROT 8.1 6.8 6.1*  ALBUMIN 4.8 3.9 3.2*   CBG: No results for input(s): GLUCAP in the last 168 hours.  Discharge time spent: approximately 45 minutes spent on discharge counseling, evaluation of patient on day of discharge, and coordination of discharge planning with nursing, social work, pharmacy and case management  Signed: Lonni SHAUNNA Dalton, MD [facility] 08/21/2024         "

## 2024-08-21 NOTE — Plan of Care (Signed)

## 2024-08-21 NOTE — Progress Notes (Signed)
 3 Days Post-Op   Subjective/Chief Complaint: No complaints. Tolerated diet   Objective: Vital signs in last 24 hours: Temp:  [97.9 F (36.6 C)-98.7 F (37.1 C)] 97.9 F (36.6 C) (01/07 0455) Pulse Rate:  [74-82] 76 (01/07 0455) Resp:  [14-23] 23 (01/07 0651) BP: (150-182)/(67-86) 160/73 (01/07 0651) SpO2:  [94 %-97 %] 96 % (01/07 0455) Last BM Date : 08/20/24  Intake/Output from previous day: 01/06 0701 - 01/07 0700 In: 480 [P.O.:480] Out: -  Intake/Output this shift: Total I/O In: 60 [P.O.:60] Out: -   General appearance: alert and cooperative Resp: clear to auscultation bilaterally Cardio: regular rate and rhythm GI: soft, nontender  Lab Results:  Recent Labs    08/19/24 0557  WBC 8.9  HGB 15.2*  HCT 46.8*  PLT 261   BMET Recent Labs    08/19/24 0557  NA 141  K 3.4*  CL 96*  CO2 31  GLUCOSE 77  BUN 35*  CREATININE 1.00  CALCIUM 9.1   PT/INR No results for input(s): LABPROT, INR in the last 72 hours. ABG No results for input(s): PHART, HCO3 in the last 72 hours.  Invalid input(s): PCO2, PO2  Studies/Results: No results found.  Anti-infectives: Anti-infectives (From admission, onward)    None       Assessment/Plan: s/p Procedures: SIGMOIDOSCOPY, FLEXIBLE (N/A) BIOPSY, GI Advance diet Sbo resolved. No indication for emergent surgery Will sign off Follow up with Dr. Teresa after d/c for evaluation of recurrent rectosigmoid cancer  LOS: 4 days    Tracy Blackwell 08/21/2024

## 2024-08-21 NOTE — Progress Notes (Signed)
 Oncology Discharge Planning Note  [facility] Address: [address], [place], [place] [address] Hours of Operation:  [place], Monday - Friday  Clinic Contact Information:  [phone]) [phone]  Oncology Care Team: Medical Oncologist:  Cloretta  Patient Details: Name:  Tracy Blackwell, Rasnick MRN:   [id number] DOB:   [birth year] Reason for Current Admission: @PPROB @  Discharge Planning Narrative: Notification of admission received by inpatient team for Darnita Dutt.  Discharge follow-up appointments for oncology are current and available on the AVS and MyChart.   Upon discharge from the hospital, hematology/oncology's post discharge plan of care for the outpatient setting is:   January 21,2026 at 11:!5 with Olam Ned NP [facility] [address] [place], [place] [address]  [phone]   Henriette Hesser will be called within two business days after discharge to review hematology/oncology's plan of care for full understanding.    Outpatient Oncology Specific Care Only: Oncology appointment transportation needs addressed?:  no Oncology medication management for symptom management addressed?:  not applicable Chemo Alert Card reviewed?:  not applicable Immunotherapy Alert Card reviewed?:  not applicable

## 2024-08-21 NOTE — Progress Notes (Signed)
 Mobility Specialist Progress Note:  During Mobility: 100-115 bpm HR   08/21/24 1016  Mobility  Activity Ambulated with assistance  Level of Assistance Contact guard assist, steadying assist  Assistive Device Front wheel walker  Distance Ambulated (ft) 160 ft  Activity Response Tolerated well  Mobility Referral Yes  Mobility visit 1 Mobility  Mobility Specialist Start Time (ACUTE ONLY) [id number]  Mobility Specialist Stop Time (ACUTE ONLY) 0946  Mobility Specialist Time Calculation (min) (ACUTE ONLY) 10 min   Pt was received in bed and agreed to mobility. Pt grew fatigued towards the end of session, recovered after 1 min. Returned to bed with all needs met and call bell in reach. Left in room with family.  [employer] - Mobility Specialist

## 2024-08-28 ENCOUNTER — Other Ambulatory Visit: Payer: Self-pay

## 2024-08-28 NOTE — Progress Notes (Signed)
 The proposed treatment discussed in conference is for discussion purpose only and is not a binding recommendation.  The patients have not been physically examined, or presented with their treatment options.  Therefore, final treatment plans cannot be decided.

## 2024-09-04 ENCOUNTER — Other Ambulatory Visit: Payer: Self-pay | Admitting: *Deleted

## 2024-09-04 ENCOUNTER — Inpatient Hospital Stay: Attending: Nurse Practitioner | Admitting: Nurse Practitioner

## 2024-09-04 ENCOUNTER — Encounter: Payer: Self-pay | Admitting: Nurse Practitioner

## 2024-09-04 ENCOUNTER — Inpatient Hospital Stay

## 2024-09-04 VITALS — BP 153/92 | HR 86 | Temp 97.7°F | Resp 18 | Ht 62.0 in | Wt 156.3 lb

## 2024-09-04 DIAGNOSIS — C19 Malignant neoplasm of rectosigmoid junction: Secondary | ICD-10-CM

## 2024-09-04 LAB — CBC WITH DIFFERENTIAL (CANCER CENTER ONLY)
Abs Immature Granulocytes: 0.06 K/uL (ref 0.00–0.07)
Basophils Absolute: 0.1 K/uL (ref 0.0–0.1)
Basophils Relative: 1 %
Eosinophils Absolute: 0.1 K/uL (ref 0.0–0.5)
Eosinophils Relative: 1 %
HCT: 43.2 % (ref 36.0–46.0)
Hemoglobin: 14.4 g/dL (ref 12.0–15.0)
Immature Granulocytes: 1 %
Lymphocytes Relative: 12 %
Lymphs Abs: 1 K/uL (ref 0.7–4.0)
MCH: 30.8 pg (ref 26.0–34.0)
MCHC: 33.3 g/dL (ref 30.0–36.0)
MCV: 92.3 fL (ref 80.0–100.0)
Monocytes Absolute: 0.5 K/uL (ref 0.1–1.0)
Monocytes Relative: 6 %
Neutro Abs: 6.7 K/uL (ref 1.7–7.7)
Neutrophils Relative %: 79 %
Platelet Count: 310 K/uL (ref 150–400)
RBC: 4.68 MIL/uL (ref 3.87–5.11)
RDW: 13.6 % (ref 11.5–15.5)
WBC Count: 8.4 K/uL (ref 4.0–10.5)
nRBC: 0 % (ref 0.0–0.2)

## 2024-09-04 LAB — CMP (CANCER CENTER ONLY)
ALT: 14 U/L (ref 0–44)
AST: 23 U/L (ref 15–41)
Albumin: 4.1 g/dL (ref 3.5–5.0)
Alkaline Phosphatase: 97 U/L (ref 38–126)
Anion gap: 13 (ref 5–15)
BUN: 16 mg/dL (ref 8–23)
CO2: 26 mmol/L (ref 22–32)
Calcium: 10.6 mg/dL — ABNORMAL HIGH (ref 8.9–10.3)
Chloride: 101 mmol/L (ref 98–111)
Creatinine: 1.16 mg/dL — ABNORMAL HIGH (ref 0.44–1.00)
GFR, Estimated: 45 mL/min — ABNORMAL LOW
Glucose, Bld: 115 mg/dL — ABNORMAL HIGH (ref 70–99)
Potassium: 4.4 mmol/L (ref 3.5–5.1)
Sodium: 140 mmol/L (ref 135–145)
Total Bilirubin: 0.7 mg/dL (ref 0.0–1.2)
Total Protein: 6.8 g/dL (ref 6.5–8.1)

## 2024-09-04 LAB — CEA (ACCESS): CEA (CHCC): 9.27 ng/mL — ABNORMAL HIGH (ref 0.00–5.00)

## 2024-09-04 NOTE — Progress Notes (Signed)
 " [facility] OFFICE PROGRESS NOTE   Diagnosis: Colon cancer  INTERVAL HISTORY:   [name] returns for follow-up.  She was hospitalized 08/16/2024 through 08/21/2024 with a small bowel obstruction.  CTs showed an enlarging presacral soft tissue mass, possible etiology of the small bowel obstruction and right hydronephrosis; there were postoperative changes in the rectosigmoid colon with fullness adjacent to the suture line.  She underwent a sigmoidoscopy with findings of a partially obstructing mass in the rectosigmoid colon.  Biopsies showed moderately differentiated adenocarcinoma.  She reports having regular bowel movements.  No pain with bowel movements.  She notes stool is black.  She continues a low fiber diet.  No nausea or vomiting.  Objective:  Vital signs in last 24 hours:  Blood pressure (!) 153/92, pulse 86, temperature 97.7 F (36.5 C), temperature source Temporal, resp. rate 18, height 5' 2 (1.575 m), weight 156 lb 4.8 oz (70.9 kg), SpO2 96%.   She requested examination in the exam room chair.  Lymphatics: No palpable cervical, supraclavicular or axillary lymph nodes. Resp: Lungs clear bilaterally.  GI: Nontender.  No hepatosplenomegaly. Vascular: No leg edema.   Lab Results:  Lab Results  Component Value Date   WBC 8.9 08/19/2024   HGB 15.2 (H) 08/19/2024   HCT 46.8 (H) 08/19/2024   MCV 95.3 08/19/2024   PLT 261 08/19/2024   NEUTROABS 5.5 07/22/2022    Imaging:  No results found.  Medications: I have reviewed the patient's current medications.  Assessment/Plan: Adenocarcinoma of the colon Partially obstructing mass at 25 cm on colonoscopy 07/17/2017 with a biopsy confirming moderately differentiated adenocarcinoma, incomplete colonoscopy CT abdomen/pelvis 07/05/2017-rectosigmoid mass with evidence of transmural spread and regional adenopathy, no evidence of distant metastatic disease Laparoscopic anterior resection 08/14/2017, stage II  (T3N0) moderately differentiated adenocarcinoma, 0/22 lymphnodes positive for metastatic carcinoma, MSI-stable, no loss of mismatch repair protein expression Colonoscopy 10/23/2017-diverticulosis nonbleeding internal hemorrhoids  Sigmoidoscopy 05/30/2022-partially obstructing large mass at 10 cm proximal to the anus-circumferential, the scope could not be advanced beyond the mass-invasive moderately differentiated adenocarcinoma, no loss of mismatch repair protein expression CTs 06/08/2022-distal colonic resection with an anastomotic suture line in the left pelvis, wall thickening involving 5 cm of bowel proximal to the suture line in the entire rectum with soft tissue stranding in the adjacent mesocolon, small perirectal lymph nodes with no evidence of distant metastatic disease, nonobstructing renal calculi, multiple renal cyst Low anterior resection 07/29/2022-mass at the prior anastomotic site, no evidence of metastatic disease, anastomosis at 10 cm from the anal verge, recurrent moderately differentiated adenocarcinoma extending into pericolonic soft tissue at anastomotic site, negative margins, 0/20 nodes, adherent adipose with an underlying 3 mm defect extending into the lumen of the specimen, no lymphovascular or perineural invasion, MSS, no loss of mismatch repair protein expression CT Abd/pelvis 10/23/2023: No acute finding renal cysts CT abdomen/pelvis 08/16/2024 mid to distal small bowel obstruction.  Enlarging presacral soft tissue mass concerning for tumor recurrence, may be etiology of the small bowel obstruction and right hydronephrosis.  Postoperative changes in the rectosigmoid colon with fullness noted in the colon adjacent to the suture line, cannot exclude tumor recurrence Flexible sigmoidoscopy 08/18/2024-fungating partially obstructing large mass found in the rectosigmoid colon and at 10 cm proximal to the anus.  Mass circumferential.  Oozing present.  Pathology-moderately differentiated  adenocarcinoma. CT chest 08/18/2024-no evidence of metastatic disease.  Unchanged 4 mm right lower lobe and 3 mm left upper lobe pulmonary nodules.  No new or enlarging lung  nodules. 08/18/2024 CEA 8.3   Hypertension Loose stool and rectal bleeding-likely secondary to #1 Hospitalized with small bowel obstruction 08/16/2024 - 08/21/2024-see #1  Disposition: Tracy Blackwell appears to have recurrent colon cancer involving the rectosigmoid colon and a pelvic mass.  We reviewed the CT images and pathology report from the sigmoidoscopy with her and her daughter at today's visit.  They have seen Dr. Teresa.  Surgery not felt to be an option.  We discussed supportive/comfort care versus a trial of systemic therapy.  She is interested in treatment.  She overall has a good performance status and appears to be a candidate for systemic therapy.  We reviewed goals of treatment are to improve the cancer and potentially extend life.  She understands no therapy will be curative.  We have requested NGS testing and will also obtain DPYD mutation and irinotecan metabolism testing.  She will return for a follow-up appointment in 2 weeks to review NGS test results and establish a treatment plan going forward.  Patient seen with Dr. Cloretta.    Olam Ned ANP/GNP-BC   09/04/2024  11:24 AM   This was a shared visit with Olam Ned.  Tracy Blackwell has been diagnosed with recurrent colon cancer.  She was admitted with a bowel obstruction, likely secondary to a presacral mass.  She was noted to have recurrent tumor in the colon on a sigmoidoscopy 08/18/2024.  Her case was presented at the GI tumor conference.  She is not a candidate for resection of the recurrent disease.  We discussed treatment options including comfort/supportive care versus a trial of systemic therapy.  She would like to undergo treatment of the cancer.  She understands no therapy will be curative.  The tumor will be submitted for NGS testing.  She will return for  an office visit and further discussion in 2 weeks.  Arvella Cloretta, MD     "

## 2024-09-18 ENCOUNTER — Inpatient Hospital Stay: Admitting: Nurse Practitioner

## 2024-09-18 ENCOUNTER — Telehealth: Payer: Self-pay | Admitting: Nurse Practitioner

## 2024-09-18 NOTE — Telephone Encounter (Signed)
 Called PT's daughter to let her know that PT's appt was rescheduled; left detailed message.

## 2024-09-19 ENCOUNTER — Encounter: Payer: Self-pay | Admitting: Oncology

## 2024-09-23 ENCOUNTER — Inpatient Hospital Stay: Admitting: Nutrition

## 2024-09-24 ENCOUNTER — Inpatient Hospital Stay: Admitting: Nutrition

## 2024-09-25 ENCOUNTER — Inpatient Hospital Stay

## 2024-09-25 ENCOUNTER — Inpatient Hospital Stay: Admitting: Nurse Practitioner

## 2024-09-27 ENCOUNTER — Inpatient Hospital Stay
